# Patient Record
Sex: Female | Born: 1990 | Race: White | Hispanic: No | Marital: Single | Smoking: Current some day smoker
Health system: Southern US, Community
[De-identification: ages and names within clinical notes are randomized; demographics above are authoritative.]

## PROBLEM LIST (undated history)

## (undated) ENCOUNTER — Inpatient Hospital Stay (HOSPITAL_COMMUNITY): Payer: BLUE CROSS/BLUE SHIELD

## (undated) DIAGNOSIS — R87620 Atypical squamous cells of undetermined significance on cytologic smear of vagina (ASC-US): Secondary | ICD-10-CM

## (undated) DIAGNOSIS — J45909 Unspecified asthma, uncomplicated: Secondary | ICD-10-CM

## (undated) HISTORY — PX: NO PAST SURGERIES: SHX2092

---

## 2013-12-23 ENCOUNTER — Emergency Department (HOSPITAL_BASED_OUTPATIENT_CLINIC_OR_DEPARTMENT_OTHER)
Admission: EM | Admit: 2013-12-23 | Discharge: 2013-12-23 | Disposition: A | Discharge status: Home / Self Care | Payer: Self-pay | Attending: Emergency Medicine | Admitting: Emergency Medicine

## 2013-12-23 ENCOUNTER — Encounter (HOSPITAL_BASED_OUTPATIENT_CLINIC_OR_DEPARTMENT_OTHER): Payer: Self-pay | Admitting: Emergency Medicine

## 2013-12-23 DIAGNOSIS — R220 Localized swelling, mass and lump, head: Secondary | ICD-10-CM | POA: Insufficient documentation

## 2013-12-23 DIAGNOSIS — R221 Localized swelling, mass and lump, neck: Secondary | ICD-10-CM

## 2013-12-23 DIAGNOSIS — F17200 Nicotine dependence, unspecified, uncomplicated: Secondary | ICD-10-CM | POA: Insufficient documentation

## 2013-12-23 DIAGNOSIS — K029 Dental caries, unspecified: Secondary | ICD-10-CM | POA: Insufficient documentation

## 2013-12-23 MED ORDER — AMOXICILLIN 500 MG PO CAPS: 500.0000 mg | ORAL_CAPSULE | Freq: Three times a day (TID) | ORAL | Status: DC

## 2013-12-23 MED ORDER — IBUPROFEN 800 MG PO TABS: 800.0000 mg | ORAL_TABLET | Freq: Three times a day (TID) | ORAL | Status: DC

## 2013-12-23 NOTE — Discharge Instructions (Signed)
Dental Caries °Dental caries is tooth decay. This decay can cause a hole in teeth (cavity) that can get bigger and deeper over time. °HOME CARE °· Brush and floss your teeth. Do this at least two times a day. °· Use a fluoride toothpaste. °· Use a mouth rinse if told by your dentist or doctor. °· Eat less sugary and starchy foods. Drink less sugary drinks. °· Avoid snacking often on sugary and starchy foods. Avoid sipping often on sugary drinks. °· Keep regular checkups and cleanings with your dentist. °· Use fluoride supplements if told by your dentist or doctor. °· Allow fluoride to be applied to teeth if told by your dentist or doctor. °MAKE SURE YOU: °· Understand these instructions. °· Will watch your condition. °· Will get help right away if you are not doing well or get worse. °Document Released: 07/07/2008 Document Revised: 05/31/2013 Document Reviewed: 09/30/2012 °ExitCare® Patient Information ©2014 [employer]. ° °Dental Pain °A tooth ache may be caused by cavities (tooth decay). Cavities expose the nerve of the tooth to air and hot or cold temperatures. It may come from an infection or abscess (also called a boil or furuncle) around your tooth. It is also often caused by dental caries (tooth decay). This causes the pain you are having. °DIAGNOSIS  °Your caregiver can diagnose this problem by exam. °TREATMENT  °· If caused by an infection, it may be treated with medications which kill germs (antibiotics) and pain medications as prescribed by your caregiver. Take medications as directed. °· Only take over-the-counter or prescription medicines for pain, discomfort, or fever as directed by your caregiver. °· Whether the tooth ache today is caused by infection or dental disease, you should see your dentist as soon as possible for further care. °SEEK MEDICAL CARE IF: °The exam and treatment you received today has been provided on an emergency basis only. This is not a substitute for complete medical or dental  care. If your problem worsens or new problems (symptoms) appear, and you are unable to meet with your dentist, call or return to this location. °SEEK IMMEDIATE MEDICAL CARE IF:  °· You have a fever. °· You develop redness and swelling of your face, jaw, or neck. °· You are unable to open your mouth. °· You have severe pain uncontrolled by pain medicine. °MAKE SURE YOU:  °· Understand these instructions. °· Will watch your condition. °· Will get help right away if you are not doing well or get worse. °Document Released: 09/28/2005 Document Revised: 12/21/2011 Document Reviewed: 05/16/2008 °ExitCare® Patient Information ©2014 [employer]. ° °

## 2013-12-23 NOTE — ED Notes (Signed)
Pt at desk upset over wait time for provider eval and main concern is that her child cannot sit still with her to wait, informed per staff that provider has signed up for her case and will be in to see her shortly.  Given coloring material and stuffed animal per staff and pt now waiting in room for provider eval.

## 2013-12-23 NOTE — ED Provider Notes (Signed)
CSN: [id number]     Arrival date & time 12/23/13  1722 History   First MD Initiated Contact with Patient 12/23/13 1819     Chief Complaint  Patient presents with  . Dental Pain     (Consider location/radiation/quality/duration/timing/severity/associated sxs/prior Treatment) HPI Comments: Patient is 23 year old female who presents with right lower 2nd molar pain for 1 day - states no fever, chills, inability to open mouth.  Patient is a 23 y.o. female presenting with tooth pain. The history is provided by the patient. No language interpreter was used.  Dental Pain Location:  Lower Lower teeth location:  31/RL 2nd molar Quality:  Aching Onset quality:  Sudden Duration:  2 days Timing:  Constant Progression:  Worsening Chronicity:  New Context: dental caries   Context: not abscess, not dental fracture, filling still in place, not recent dental surgery and not trauma   Relieved by:  Nothing Worsened by:  Nothing tried Ineffective treatments:  None tried Associated symptoms: gum swelling   Associated symptoms: no congestion, no drooling, no facial pain, no facial swelling, no fever, no neck pain, no neck swelling, no oral bleeding and no trismus     History reviewed. No pertinent past medical history. History reviewed. No pertinent past surgical history. No family history on file. History  Substance Use Topics  . Smoking status: Current Every Day Smoker  . Smokeless tobacco: Not on file  . Alcohol Use: Yes   OB History   Grav Para Term Preterm Abortions TAB SAB Ect Mult Living                 Review of Systems  Constitutional: Negative for fever.  HENT: Negative for congestion, drooling and facial swelling.   Musculoskeletal: Negative for neck pain.  All other systems reviewed and are negative.      Allergies  Aspirin  Home Medications   Current Outpatient Rx  Name  Route  Sig  Dispense  Refill  . Multiple Vitamin (MULTIVITAMIN) tablet   Oral   Take 1 tablet  by mouth daily.          BP 123/65  Pulse 80  Temp(Src) 98.9 F (37.2 C) (Oral)  Resp 18  Ht 5\' 3"  (1.6 m)  Wt 165 lb (74.844 kg)  BMI 29.24 kg/m2  SpO2 100%  LMP 12/18/2013 Physical Exam  Nursing note and vitals reviewed. Constitutional: She is oriented to person, place, and time. She appears well-developed and well-nourished. No distress.  HENT:  Head: Normocephalic and atraumatic.  Right Ear: External ear normal.  Left Ear: External ear normal.  Nose: Nose normal.  Mouth/Throat: Oropharynx is clear and moist. No oropharyngeal exudate.    Eyes: Conjunctivae are normal. Pupils are equal, round, and reactive to light. No scleral icterus.  Neck: Normal range of motion.  Pulmonary/Chest: Effort normal.  Musculoskeletal: Normal range of motion. She exhibits no edema and no tenderness.  Neurological: She is alert and oriented to person, place, and time. She exhibits normal muscle tone. Coordination normal.  Skin: Skin is warm and dry. No rash noted. No erythema. No pallor.  Psychiatric: She has a normal mood and affect. Her behavior is normal. Judgment and thought content normal.    ED Course  Dental Date/Time: 12/23/2013 6:45 PM Performed by: Marisue HumbleSANFORD, Kealohilani Maiorino C. Authorized by: Patrecia PourSANFORD, Kristalynn Coddington C. Consent: Verbal consent obtained. written consent not obtained. Risks and benefits: risks, benefits and alternatives were discussed Consent given by: patient Patient understanding: patient states understanding of the  procedure being performed Patient consent: the patient's understanding of the procedure does not match consent given Procedure consent: procedure consent does not match procedure scheduled Relevant documents: relevant documents not present or verified Test results: test results not available Site marked: the operative site was not marked Imaging studies: imaging studies not available Patient identity confirmed: verbally with patient and arm band Time out:  Immediately prior to procedure a "time out" was called to verify the correct patient, procedure, equipment, support staff and site/side marked as required. Local anesthesia used: no Patient sedated: no Patient tolerance: Patient tolerated the procedure well with no immediate complications. Comments: Performed right lower inferior aveolar dental block without complications.   (including critical care time) Labs Review Labs Reviewed - No data to display Imaging Review No results found.   EKG Interpretation None      MDM   Dental pain  Patient here with pain from dental caries - offered dental block for pain control which patient accepted.  Will start on amoxicillin and ibuprofen for pain.    Izola Price Marisue Humble, [place] 12/23/13 [phone]

## 2013-12-23 NOTE — ED Notes (Signed)
Onset of right lower dental pain this morning.  No known injury.  Thinks she has a cavity or infection.

## 2013-12-24 NOTE — ED Provider Notes (Signed)
Medical screening examination/treatment/procedure(s) were performed by non-physician practitioner and as supervising physician I was immediately available for consultation/collaboration.   EKG Interpretation None        Audree CamelScott T Chaniyah Jahr, MD 12/24/13 1144

## 2014-03-24 ENCOUNTER — Encounter (HOSPITAL_BASED_OUTPATIENT_CLINIC_OR_DEPARTMENT_OTHER): Payer: Self-pay | Admitting: Emergency Medicine

## 2014-03-24 ENCOUNTER — Emergency Department (HOSPITAL_BASED_OUTPATIENT_CLINIC_OR_DEPARTMENT_OTHER)
Admission: EM | Admit: 2014-03-24 | Discharge: 2014-03-24 | Disposition: A | Discharge status: Home / Self Care | Payer: 59 | Attending: Emergency Medicine | Admitting: Emergency Medicine

## 2014-03-24 DIAGNOSIS — Z3202 Encounter for pregnancy test, result negative: Secondary | ICD-10-CM | POA: Insufficient documentation

## 2014-03-24 DIAGNOSIS — F17200 Nicotine dependence, unspecified, uncomplicated: Secondary | ICD-10-CM | POA: Insufficient documentation

## 2014-03-24 DIAGNOSIS — Z792 Long term (current) use of antibiotics: Secondary | ICD-10-CM | POA: Insufficient documentation

## 2014-03-24 DIAGNOSIS — Z79899 Other long term (current) drug therapy: Secondary | ICD-10-CM | POA: Insufficient documentation

## 2014-03-24 DIAGNOSIS — Z791 Long term (current) use of non-steroidal anti-inflammatories (NSAID): Secondary | ICD-10-CM | POA: Insufficient documentation

## 2014-03-24 DIAGNOSIS — A5901 Trichomonal vulvovaginitis: Secondary | ICD-10-CM | POA: Insufficient documentation

## 2014-03-24 DIAGNOSIS — A599 Trichomoniasis, unspecified: Secondary | ICD-10-CM

## 2014-03-24 DIAGNOSIS — N939 Abnormal uterine and vaginal bleeding, unspecified: Secondary | ICD-10-CM

## 2014-03-24 LAB — PREGNANCY, URINE: Preg Test, Ur: NEGATIVE

## 2014-03-24 LAB — CBC
HCT: 39.8 % (ref 36.0–46.0)
HEMOGLOBIN: 13.7 g/dL (ref 12.0–15.0)
MCH: 31.1 pg (ref 26.0–34.0)
MCHC: 34.4 g/dL (ref 30.0–36.0)
MCV: 90.2 fL (ref 78.0–100.0)
PLATELETS: 161 10*3/uL (ref 150–400)
RBC: 4.41 MIL/uL (ref 3.87–5.11)
RDW: 12.6 % (ref 11.5–15.5)
WBC: 9.1 10*3/uL (ref 4.0–10.5)

## 2014-03-24 LAB — URINALYSIS, ROUTINE W REFLEX MICROSCOPIC
Bilirubin Urine: NEGATIVE
Glucose, UA: NEGATIVE mg/dL
Ketones, ur: NEGATIVE mg/dL
LEUKOCYTES UA: NEGATIVE
Nitrite: NEGATIVE
PROTEIN: NEGATIVE mg/dL
Specific Gravity, Urine: 1.005 (ref 1.005–1.030)
UROBILINOGEN UA: 0.2 mg/dL (ref 0.0–1.0)
pH: 7.5 (ref 5.0–8.0)

## 2014-03-24 LAB — URINE MICROSCOPIC-ADD ON

## 2014-03-24 LAB — WET PREP, GENITAL
Trich, Wet Prep: NONE SEEN
Yeast Wet Prep HPF POC: NONE SEEN

## 2014-03-24 MED ORDER — METRONIDAZOLE 500 MG PO TABS
2000.0000 mg | ORAL_TABLET | Freq: Once | ORAL | Status: AC
  Administered 2014-03-24: 2000 mg via ORAL
  Filled 2014-03-24: qty 4

## 2014-03-24 MED ORDER — FLUCONAZOLE 150 MG PO TABS: 150.0000 mg | ORAL_TABLET | Freq: Every day | ORAL | Status: AC

## 2014-03-24 NOTE — ED Provider Notes (Signed)
CSN: [id number]     Arrival date & time 03/24/14  1148 History   First MD Initiated Contact with Patient 03/24/14 1159     Chief Complaint  Patient presents with  . Vaginal Bleeding     (Consider location/radiation/quality/duration/timing/severity/associated sxs/prior Treatment) Patient is a 23 y.o. female presenting with vaginal bleeding. The history is provided by the patient. No language interpreter was used.  Vaginal Bleeding Quality:  Clots Severity:  Moderate Possible pregnancy: no   Associated symptoms: abdominal pain and back pain   Associated symptoms: no dysuria, no fever and no nausea   Associated symptoms comment:  She had a normal menses at the end of last month (2 weeks ago) and woke up during the night with vaginal bleeding and severe lower abdominal cramping. She continues with a heavy flow, including clots, and pain this morning. No fever. She denies dysuria or history of irregular menses.    History reviewed. No pertinent past medical history. History reviewed. No pertinent past surgical history. No family history on file. History  Substance Use Topics  . Smoking status: Current Every Day Smoker  . Smokeless tobacco: Not on file  . Alcohol Use: Yes   OB History   Grav Para Term Preterm Abortions TAB SAB Ect Mult Living                 Review of Systems  Constitutional: Negative for fever.  Gastrointestinal: Positive for abdominal pain. Negative for nausea and vomiting.  Genitourinary: Positive for vaginal bleeding and pelvic pain. Negative for dysuria.  Musculoskeletal: Positive for back pain.  Neurological: Negative for light-headedness.      Allergies  Aspirin  Home Medications   Prior to Admission medications   Medication Sig Start Date End Date Taking? Authorizing Provider  amoxicillin (AMOXIL) 500 MG capsule Take 1 capsule (500 mg total) by mouth 3 (three) times daily. 12/23/13   Izola PriceFrances C. Sanford, PA-C  ibuprofen (ADVIL,MOTRIN) 800 MG tablet  Take 1 tablet (800 mg total) by mouth 3 (three) times daily. 12/23/13   Izola PriceFrances C. Sanford, PA-C  Multiple Vitamin (MULTIVITAMIN) tablet Take 1 tablet by mouth daily.    Historical Provider, MD   BP 117/76  Pulse 77  Temp(Src) 98.7 F (37.1 C) (Oral)  Resp 18  Ht 5\' 3"  (1.6 m)  Wt 165 lb (74.844 kg)  BMI 29.24 kg/m2  SpO2 100%  LMP 03/07/2014 Physical Exam  Constitutional: She appears well-developed and well-nourished.  HENT:  Head: Normocephalic.  Neck: Normal range of motion. Neck supple.  Cardiovascular: Normal rate and regular rhythm.   Pulmonary/Chest: Effort normal and breath sounds normal.  Abdominal: Soft. Bowel sounds are normal. There is no tenderness. There is no rebound and no guarding.  Genitourinary: Vagina normal and uterus normal.  Cervical bleeding with clots present. Diffusely tender throughout pelvis without mass.    Musculoskeletal: Normal range of motion.  Neurological: She is alert. No cranial nerve deficit.  Skin: Skin is warm and dry. No rash noted.  Psychiatric: She has a normal mood and affect.    ED Course  Procedures (including critical care time) Labs Review Labs Reviewed  PREGNANCY, URINE  URINALYSIS, ROUTINE W REFLEX MICROSCOPIC   Results for orders placed during the hospital encounter of 03/24/14  WET PREP, GENITAL      Result Value Ref Range   Yeast Wet Prep HPF POC NONE SEEN  NONE SEEN   Trich, Wet Prep NONE SEEN  NONE SEEN   Clue Cells Wet Prep  HPF POC FEW (*) NONE SEEN   WBC, Wet Prep HPF POC FEW (*) NONE SEEN  GC/CHLAMYDIA PROBE AMP      Result Value Ref Range   CT Probe RNA NEGATIVE  NEGATIVE   GC Probe RNA NEGATIVE  NEGATIVE  PREGNANCY, URINE      Result Value Ref Range   Preg Test, Ur NEGATIVE  NEGATIVE  URINALYSIS, ROUTINE W REFLEX MICROSCOPIC      Result Value Ref Range   Color, Urine YELLOW  YELLOW   APPearance CLEAR  CLEAR   Specific Gravity, Urine 1.005  1.005 - 1.030   pH 7.5  5.0 - 8.0   Glucose, UA NEGATIVE   NEGATIVE mg/dL   Hgb urine dipstick SMALL (*) NEGATIVE   Bilirubin Urine NEGATIVE  NEGATIVE   Ketones, ur NEGATIVE  NEGATIVE mg/dL   Protein, ur NEGATIVE  NEGATIVE mg/dL   Urobilinogen, UA 0.2  0.0 - 1.0 mg/dL   Nitrite NEGATIVE  NEGATIVE   Leukocytes, UA NEGATIVE  NEGATIVE  CBC      Result Value Ref Range   WBC 9.1  4.0 - 10.5 K/uL   RBC 4.41  3.87 - 5.11 MIL/uL   Hemoglobin 13.7  12.0 - 15.0 g/dL   HCT 16.139.8  09.636.0 - 04.546.0 %   MCV 90.2  78.0 - 100.0 fL   MCH 31.1  26.0 - 34.0 pg   MCHC 34.4  30.0 - 36.0 g/dL   RDW 40.912.6  81.111.5 - 91.415.5 %   Platelets 161  150 - 400 K/uL  URINE MICROSCOPIC-ADD ON      Result Value Ref Range   Squamous Epithelial / LPF RARE  RARE   WBC, UA 0-2  <3 WBC/hpf   Urine-Other TRICHOMONAS PRESENT     No results found.  Imaging Review No results found.   EKG Interpretation None      MDM   Final diagnoses:  None    1. Vaginal bleeding 2. Trichomonas infection  HGB stable, negative pregnancy, essentially benign exam. Patient stable for discharge home and outpatient GYN follow up for irregular bleeding.    Arnoldo HookerShari A Annessa Satre, PA-C 04/02/14 1615

## 2014-03-24 NOTE — Discharge Instructions (Signed)
Trichomoniasis Trichomoniasis is an infection, caused by the Trichomonas organism, that affects both women and men. In women, the outer female genitalia and the vagina are affected. In men, the penis is mainly affected, but the prostate and other reproductive organs can also be involved. Trichomoniasis is a sexually transmitted disease (STD) and is most often passed to another person through sexual contact. The majority of people who get trichomoniasis do so from a sexual encounter and are also at risk for other STDs. CAUSES   Sexual intercourse with an infected partner.  It can be present in swimming pools or hot tubs. SYMPTOMS   Abnormal gray-green frothy vaginal discharge in women.  Vaginal itching and irritation in women.  Itching and irritation of the area outside the vagina in women.  Penile discharge with or without pain in males.  Inflammation of the urethra (urethritis), causing painful urination.  Bleeding after sexual intercourse. RELATED COMPLICATIONS  Pelvic inflammatory disease.  Infection of the uterus (endometritis).  Infertility.  Tubal (ectopic) pregnancy.  It can be associated with other STDs, including gonorrhea and chlamydia, hepatitis B, and HIV. COMPLICATIONS DURING PREGNANCY  Early (premature) delivery.  Premature rupture of the membranes (PROM).  Low birth weight. DIAGNOSIS   Visualization of Trichomonas under the microscope from the vagina discharge.  Ph of the vagina greater than 4.5, tested with a test tape.  Trich Rapid Test.  Culture of the organism, but this is not usually needed.  It may be found on a Pap test.  Having a "strawberry cervix,"which means the cervix looks very red like a strawberry. TREATMENT   You may be given medication to fight the infection. Inform your caregiver if you could be or are pregnant. Some medications used to treat the infection should not be taken during pregnancy.  Over-the-counter medications or  creams to decrease itching or irritation may be recommended.  Your sexual partner will need to be treated if infected. HOME CARE INSTRUCTIONS   Take all medication prescribed by your caregiver.  Take over-the-counter medication for itching or irritation as directed by your caregiver.  Do not have sexual intercourse while you have the infection.  Do not douche or wear tampons.  Discuss your infection with your partner, as your partner may have acquired the infection from you. Or, your partner may have been the person who transmitted the infection to you.  Have your sex partner examined and treated if necessary.  Practice safe, informed, and protected sex.  See your caregiver for other STD testing. SEEK MEDICAL CARE IF:   You still have symptoms after you finish the medication.  You have an oral temperature above 102 F (38.9 C).  You develop belly (abdominal) pain.  You have pain when you urinate.  You have bleeding after sexual intercourse.  You develop a rash.  The medication makes you sick or makes you throw up (vomit). Document Released: 03/24/2001 Document Revised: 12/21/2011 Document Reviewed: 04/19/2009 [facility] Patient Information 2014 [place], [place].  Abnormal Uterine Bleeding Abnormal uterine bleeding means bleeding from the vagina that is not your normal menstrual period. This can be:  Bleeding or spotting between periods.  Bleeding after sex (sexual intercourse).  Bleeding that is heavier or more than normal.  Periods that last longer than usual.  Bleeding after menopause. There are many problems that may cause this. Treatment will depend on the cause of the bleeding. Any kind of bleeding that is not normal should be reviewed by your doctor.  HOME CARE Watch your condition  for any changes. These actions may lessen any discomfort you are having:  Do not use tampons or douches as told by your doctor.  Change your pads often. You should get regular  pelvic exams and Pap tests. Keep all appointments for tests as told by your doctor. GET HELP IF:  You are bleeding for more than 1 week.  You feel dizzy at times. GET HELP RIGHT AWAY IF:   You pass out.  You have to change pads every 15 to 30 minutes.  You have belly pain.  You have a fever.  You become sweaty or weak.  You are passing large blood clots from the vagina.  You feel sick to your stomach (nauseous) and throw up (vomit). MAKE SURE YOU:  Understand these instructions.  Will watch your condition.  Will get help right away if you are not doing well or get worse. Document Released: 07/26/2009 Document Revised: 07/19/2013 Document Reviewed: 04/27/2013 [facility] Center[facility] Patient Information 2014 [place]Care, [place].

## 2014-03-24 NOTE — ED Notes (Signed)
States she is passing large vaginal clots since this morning. Also states that she is having severe cramping.

## 2014-03-26 LAB — GC/CHLAMYDIA PROBE AMP
CT Probe RNA: NEGATIVE
GC Probe RNA: NEGATIVE

## 2014-04-02 NOTE — ED Provider Notes (Signed)
Medical screening examination/treatment/procedure(s) were performed by non-physician practitioner and as supervising physician I was immediately available for consultation/collaboration.   EKG Interpretation None        Lyanne CoKevin M Shanterica Biehler, MD 04/02/14 1821

## 2014-05-15 ENCOUNTER — Encounter (HOSPITAL_BASED_OUTPATIENT_CLINIC_OR_DEPARTMENT_OTHER): Payer: Self-pay | Admitting: Emergency Medicine

## 2014-05-15 ENCOUNTER — Emergency Department (HOSPITAL_BASED_OUTPATIENT_CLINIC_OR_DEPARTMENT_OTHER)
Admission: EM | Admit: 2014-05-15 | Discharge: 2014-05-15 | Disposition: A | Discharge status: Home / Self Care | Payer: 59 | Attending: Emergency Medicine | Admitting: Emergency Medicine

## 2014-05-15 DIAGNOSIS — Z791 Long term (current) use of non-steroidal anti-inflammatories (NSAID): Secondary | ICD-10-CM | POA: Diagnosis not present

## 2014-05-15 DIAGNOSIS — Z3202 Encounter for pregnancy test, result negative: Secondary | ICD-10-CM | POA: Insufficient documentation

## 2014-05-15 DIAGNOSIS — F17200 Nicotine dependence, unspecified, uncomplicated: Secondary | ICD-10-CM | POA: Insufficient documentation

## 2014-05-15 DIAGNOSIS — Z792 Long term (current) use of antibiotics: Secondary | ICD-10-CM | POA: Diagnosis not present

## 2014-05-15 DIAGNOSIS — J45909 Unspecified asthma, uncomplicated: Secondary | ICD-10-CM | POA: Diagnosis not present

## 2014-05-15 DIAGNOSIS — G43709 Chronic migraine without aura, not intractable, without status migrainosus: Secondary | ICD-10-CM | POA: Diagnosis not present

## 2014-05-15 DIAGNOSIS — R42 Dizziness and giddiness: Secondary | ICD-10-CM | POA: Diagnosis present

## 2014-05-15 HISTORY — DX: Unspecified asthma, uncomplicated: J45.909

## 2014-05-15 LAB — URINALYSIS, ROUTINE W REFLEX MICROSCOPIC
BILIRUBIN URINE: NEGATIVE
Glucose, UA: NEGATIVE mg/dL
Hgb urine dipstick: NEGATIVE
KETONES UR: NEGATIVE mg/dL
Leukocytes, UA: NEGATIVE
NITRITE: NEGATIVE
PROTEIN: NEGATIVE mg/dL
Specific Gravity, Urine: 1.002 — ABNORMAL LOW (ref 1.005–1.030)
Urobilinogen, UA: 0.2 mg/dL (ref 0.0–1.0)
pH: 7 (ref 5.0–8.0)

## 2014-05-15 LAB — BASIC METABOLIC PANEL
Anion gap: 14 (ref 5–15)
BUN: 5 mg/dL — AB (ref 6–23)
CO2: 24 mEq/L (ref 19–32)
CREATININE: 0.5 mg/dL (ref 0.50–1.10)
Calcium: 9.4 mg/dL (ref 8.4–10.5)
Chloride: 102 mEq/L (ref 96–112)
GFR calc Af Amer: >90 mL/min (ref >90)
GFR calc non Af Amer: >90 mL/min (ref >90)
GLUCOSE: 92 mg/dL (ref 70–99)
Potassium: 3.7 mEq/L (ref 3.7–5.3)
Sodium: 140 mEq/L (ref 137–147)

## 2014-05-15 LAB — CBG MONITORING, ED: Glucose-Capillary: 99 mg/dL (ref 70–99)

## 2014-05-15 LAB — PREGNANCY, URINE: Preg Test, Ur: NEGATIVE

## 2014-05-15 MED ORDER — SODIUM CHLORIDE 0.9 % IV BOLUS (SEPSIS)
1000.0000 mL | Freq: Once | INTRAVENOUS | Status: AC
Start: 2014-05-15 — End: 2014-05-15
  Administered 2014-05-15: 1000 mL via INTRAVENOUS

## 2014-05-15 NOTE — ED Notes (Signed)
Pt reports dizziness x 2 weeks with onset of "on and off" blurry vision this morning.

## 2014-05-15 NOTE — ED Provider Notes (Signed)
Medical screening examination/treatment/procedure(s) were performed by non-physician practitioner and as supervising physician I was immediately available for consultation/collaboration.   EKG Interpretation None       Martha K Linker, MD 05/15/14 1915 

## 2014-05-15 NOTE — Discharge Instructions (Signed)
Recurrent Migraine Headache A migraine headache is an intense, throbbing pain on one or both sides of your head. Recurrent migraines keep coming back. A migraine can last for 30 minutes to several hours. CAUSES  The exact cause of a migraine headache is not always known. However, a migraine may be caused when nerves in the brain become irritated and release chemicals that cause inflammation. This causes pain. Certain things may also trigger migraines, such as:   Alcohol.  Smoking.  Stress.  Menstruation.  Aged cheeses.  Foods or drinks that contain nitrates, glutamate, aspartame, or tyramine.  Lack of sleep.  Chocolate.  Caffeine.  Hunger.  Physical exertion.  Fatigue.  Medicines used to treat chest pain (nitroglycerine), birth control pills, estrogen, and some blood pressure medicines. SYMPTOMS   Pain on one or both sides of your head.  Pulsating or throbbing pain.  Severe pain that prevents daily activities.  Pain that is aggravated by any physical activity.  Nausea, vomiting, or both.  Dizziness.  Pain with exposure to bright lights, loud noises, or activity.  General sensitivity to bright lights, loud noises, or smells. Before you get a migraine, you may get warning signs that a migraine is coming (aura). An aura may include:  Seeing flashing lights.  Seeing bright spots, halos, or zigzag lines.  Having tunnel vision or blurred vision.  Having feelings of numbness or tingling.  Having trouble talking.  Having muscle weakness. DIAGNOSIS  A recurrent migraine headache is often diagnosed based on:  Symptoms.  Physical examination.  A CT scan or MRI of your head. These imaging tests cannot diagnose migraines but can help rule out other causes of headaches.  TREATMENT  Medicines may be given for pain and nausea. Medicines can also be given to help prevent recurrent migraines. HOME CARE INSTRUCTIONS  Only take over-the-counter or prescription  medicines for pain or discomfort as directed by your health care provider. The use of long-term narcotics is not recommended.  Lie down in a dark, quiet room when you have a migraine.  Keep a journal to find out what may trigger your migraine headaches. For example, write down:  What you eat and drink.  How much sleep you get.  Any change to your diet or medicines.  Limit alcohol consumption.  Quit smoking if you smoke.  Get 7-9 hours of sleep, or as recommended by your health care provider.  Limit stress.  Keep lights dim if bright lights bother you and make your migraines worse. SEEK MEDICAL CARE IF:   You do not get relief from the medicines given to you.  You have a recurrence of pain.  You have a fever. SEEK IMMEDIATE MEDICAL CARE IF:  Your migraine becomes severe.  You have a stiff neck.  You have loss of vision.  You have muscular weakness or loss of muscle control.  You start losing your balance or have trouble walking.  You feel faint or pass out.  You have severe symptoms that are different from your first symptoms. MAKE SURE YOU:   Understand these instructions.  Will watch your condition.  Will get help right away if you are not doing well or get worse. Document Released: 06/23/2001 Document Revised: 02/12/2014 Document Reviewed: 06/05/2013 Exit[employer] Patient Information 2015 [employer]. This information is not intended to replace advice given to you by your health care provider. Make sure you discuss any questions you have with your health care provider.  

## 2014-05-15 NOTE — ED Provider Notes (Signed)
CSN: [id number]     Arrival date & time 05/15/14  1642 History   First MD Initiated Contact with Patient 05/15/14 1653     Chief Complaint  Patient presents with  . Dizziness     (Consider location/radiation/quality/duration/timing/severity/associated sxs/prior Treatment) Patient is a 23 y.o. female presenting with dizziness. The history is provided by the patient. No language interpreter was used.  Dizziness Associated symptoms: headaches and vision changes   Associated symptoms: no chest pain, no diarrhea, no nausea, no shortness of breath and no vomiting   Associated symptoms comment:  Ashley Tate is a 23 year old female with a hx of asthma who presents to the ED with blurry vision and headache since this morning. Patient states she was at work on the computer when she began to notice her vision getting progressively more blurry. Her headache pain is located in the right parietal region and is described as throbbing locally with "a lot of pressure throughout." Took tylenol with mild symptom relief. States she has had 2 weeks of lightheadedness with intermittent headaches. She has used her Albuterol inhaler twice in the past week thinking her symptoms could be due to her asthma. Denies SOB, nausea, vomiting, appetite changes, fever or chills.     Past Medical History  Diagnosis Date  . Asthma    History reviewed. No pertinent past surgical history. No family history on file. History  Substance Use Topics  . Smoking status: Current Every Day Smoker  . Smokeless tobacco: Not on file  . Alcohol Use: Yes   OB History   Grav Para Term Preterm Abortions TAB SAB Ect Mult Living                 Review of Systems  Constitutional: Negative for fever.  Eyes: Positive for photophobia and visual disturbance.  Respiratory: Negative for shortness of breath.   Cardiovascular: Negative for chest pain.  Gastrointestinal: Negative for nausea, vomiting and diarrhea.  Musculoskeletal: Negative  for gait problem and neck stiffness.  Neurological: Positive for dizziness, light-headedness and headaches.  All other systems reviewed and are negative.     Allergies  Aspirin  Home Medications   Prior to Admission medications   Medication Sig Start Date End Date Taking? Authorizing Provider  amoxicillin (AMOXIL) 500 MG capsule Take 1 capsule (500 mg total) by mouth 3 (three) times daily. 12/23/13   Izola Price. Sanford, PA-C  ibuprofen (ADVIL,MOTRIN) 800 MG tablet Take 1 tablet (800 mg total) by mouth 3 (three) times daily. 12/23/13   Izola Price. Sanford, PA-C  Multiple Vitamin (MULTIVITAMIN) tablet Take 1 tablet by mouth daily.    Historical Provider, MD   BP 130/59  Pulse 85  Temp(Src) 99.5 F (37.5 C) (Oral)  Resp 16  Ht 5\' 3"  (1.6 m)  Wt 160 lb (72.576 kg)  BMI 28.35 kg/m2  SpO2 98%  LMP 04/22/2014 Physical Exam  Constitutional: She appears well-developed and well-nourished.  HENT:  Head: Normocephalic.  Eyes: EOM are normal. Pupils are equal, round, and reactive to light.  Neck: Normal range of motion. Neck supple.  Cardiovascular: Normal rate and regular rhythm.   Pulmonary/Chest: Effort normal and breath sounds normal.  Abdominal: Soft. Bowel sounds are normal. There is no tenderness. There is no rebound and no guarding.  Musculoskeletal: Normal range of motion.  Neurological: She is alert. No cranial nerve deficit. Gait normal.  Skin: Skin is warm and dry. No rash noted.  Psychiatric: She has a normal mood and affect.  ED Course  Procedures (including critical care time) Labs Review Labs Reviewed  URINALYSIS, ROUTINE W REFLEX MICROSCOPIC  PREGNANCY, URINE  BASIC METABOLIC PANEL  CBG MONITORING, ED   Results for orders placed during the hospital encounter of 05/15/14  URINALYSIS, ROUTINE W REFLEX MICROSCOPIC      Result Value Ref Range   Color, Urine YELLOW  YELLOW   APPearance CLEAR  CLEAR   Specific Gravity, Urine 1.002 (*) 1.005 - 1.030   pH 7.0  5.0  - 8.0   Glucose, UA NEGATIVE  NEGATIVE mg/dL   Hgb urine dipstick NEGATIVE  NEGATIVE   Bilirubin Urine NEGATIVE  NEGATIVE   Ketones, ur NEGATIVE  NEGATIVE mg/dL   Protein, ur NEGATIVE  NEGATIVE mg/dL   Urobilinogen, UA 0.2  0.0 - 1.0 mg/dL   Nitrite NEGATIVE  NEGATIVE   Leukocytes, UA NEGATIVE  NEGATIVE  PREGNANCY, URINE      Result Value Ref Range   Preg Test, Ur NEGATIVE  NEGATIVE  CBG MONITORING, ED      Result Value Ref Range   Glucose-Capillary 99  70 - 99 mg/dL    Imaging Review No results found.   EKG Interpretation None      MDM  Anemia- H&H Pregnancy- UPT Migraine w/ blurred vision Asthma- lungs sounded clear; no wheezing noted  Final diagnoses:  None    1835- headache and blurry vision decreasing in severity with IVF. Feels less fatigued. UPT negative, UA normal, CBG WNL  She is ambulatory in the department, using her phone freely and without obvious visual problems. VSS. Headache is resolved without further intervention beyond IV fluids. She is well appearing and stable for discharge.   1. Migraine     Arnoldo HookerShari A Akash Winski, PA-C 05/15/14 [phone]

## 2015-09-23 ENCOUNTER — Encounter (HOSPITAL_BASED_OUTPATIENT_CLINIC_OR_DEPARTMENT_OTHER): Payer: Self-pay | Admitting: Emergency Medicine

## 2015-09-23 ENCOUNTER — Emergency Department (HOSPITAL_BASED_OUTPATIENT_CLINIC_OR_DEPARTMENT_OTHER): Payer: BLUE CROSS/BLUE SHIELD

## 2015-09-23 ENCOUNTER — Emergency Department (HOSPITAL_BASED_OUTPATIENT_CLINIC_OR_DEPARTMENT_OTHER)
Admission: EM | Admit: 2015-09-23 | Discharge: 2015-09-23 | Disposition: A | Discharge status: Home / Self Care | Payer: BLUE CROSS/BLUE SHIELD | Attending: Emergency Medicine | Admitting: Emergency Medicine

## 2015-09-23 DIAGNOSIS — Z79899 Other long term (current) drug therapy: Secondary | ICD-10-CM | POA: Diagnosis not present

## 2015-09-23 DIAGNOSIS — F17200 Nicotine dependence, unspecified, uncomplicated: Secondary | ICD-10-CM | POA: Insufficient documentation

## 2015-09-23 DIAGNOSIS — Z792 Long term (current) use of antibiotics: Secondary | ICD-10-CM | POA: Insufficient documentation

## 2015-09-23 DIAGNOSIS — R05 Cough: Secondary | ICD-10-CM | POA: Insufficient documentation

## 2015-09-23 DIAGNOSIS — J45901 Unspecified asthma with (acute) exacerbation: Secondary | ICD-10-CM | POA: Insufficient documentation

## 2015-09-23 DIAGNOSIS — Z791 Long term (current) use of non-steroidal anti-inflammatories (NSAID): Secondary | ICD-10-CM | POA: Insufficient documentation

## 2015-09-23 DIAGNOSIS — R059 Cough, unspecified: Secondary | ICD-10-CM

## 2015-09-23 MED ORDER — ALBUTEROL SULFATE HFA 108 (90 BASE) MCG/ACT IN AERS: 1.0000 | INHALATION_SPRAY | Freq: Four times a day (QID) | RESPIRATORY_TRACT | Status: DC | PRN

## 2015-09-23 MED ORDER — DEXAMETHASONE SODIUM PHOSPHATE 10 MG/ML IJ SOLN
10.0000 mg | Freq: Once | INTRAMUSCULAR | Status: AC
  Administered 2015-09-23: 10 mg via INTRAMUSCULAR
  Filled 2015-09-23: qty 1

## 2015-09-23 NOTE — ED Provider Notes (Signed)
CSN: [id number]     Arrival date & time 09/23/15  1549 History  By signing my name below, I, Gonzella Lex, attest that this documentation has been prepared under the direction and in the presence of Nelva Nay, MD. Electronically Signed: Gonzella Lex, Scribe. 09/23/2015. 4:32 PM.    Chief Complaint  Patient presents with  . Shortness of Breath  . Cough    Patient is a 24 y.o. female presenting with cough. The history is provided by the patient. No language interpreter was used.  Cough  HPI Comments: Ashley Tate is a 24 y.o. female with a h/o asthma who presents to the Emergency Department complaining of a productive cough and SOB for the past three days with associated difficulty sleeping secondary to cough. Pt reports that she has taken mucinex and robitussen with little to no relief. She states that the mucous has gradually begun to clear up. Pt notes that she sometimes uses an inhaler at home but states that she has run out. Pt denies wheezing and hx of DM. She is sensitive to aspirin.   Past Medical History  Diagnosis Date  . Asthma    History reviewed. No pertinent past surgical history. History reviewed. No pertinent family history. Social History  Substance Use Topics  . Smoking status: Current Every Day Smoker  . Smokeless tobacco: None  . Alcohol Use: Yes   OB History    No data available     Review of Systems  Respiratory: Positive for cough.    A complete 10 system review of systems was obtained and all systems are negative except as noted in the HPI and PMH.   Allergies  Aspirin  Home Medications   Prior to Admission medications   Medication Sig Start Date End Date Taking? Authorizing Provider  amoxicillin (AMOXIL) 500 MG capsule Take 1 capsule (500 mg total) by mouth 3 (three) times daily. 12/23/13   Cherrie Distance, PA-C  ibuprofen (ADVIL,MOTRIN) 800 MG tablet Take 1 tablet (800 mg total) by mouth 3 (three) times daily. 12/23/13   Cherrie Distance, PA-C  Multiple Vitamin (MULTIVITAMIN) tablet Take 1 tablet by mouth daily.    Historical Provider, MD   BP 132/82 mmHg  Pulse 94  Temp(Src) 98.8 F (37.1 C) (Oral)  Resp 22  Ht  (1.6 m)  Wt 165 lb (74.844 kg)  BMI 29.24 kg/m2  SpO2 100%  LMP 09/11/2015 Physical Exam Physical Exam  Nursing note and vitals reviewed. Constitutional: She is oriented to person, place, and time. She appears well-developed and well-nourished. No distress.  HENT:  Head: Normocephalic and atraumatic.  Eyes: Pupils are equal, round, and reactive to light.  Neck: Normal range of motion.  Cardiovascular: Normal rate and intact distal pulses.   Pulmonary/Chest: No respiratory distress.  Abdominal: Normal appearance. She exhibits no distension.  Musculoskeletal: Normal range of motion.  Neurological: She is alert and oriented to person, place, and time. No cranial nerve deficit.  Skin: Skin is warm and dry. No rash noted.  Psychiatric: She has a normal mood and affect. Her behavior is normal.   ED Course  Procedures  DIAGNOSTIC STUDIES:    Oxygen Saturation is 100% on RA, normal by my interpretation.   COORDINATION OF CARE:  4:16 PM Reviewed pt's chest x-ray. Will refill pt's inhaler. Will administer 10 mg dexamethasone injection to pt in the ED. Discussed treatment plan with pt at bedside and pt agreed to plan.   Imaging Review Dg Chest 2  View  09/23/2015  CLINICAL DATA:  Cough and shortness of breath for 1 week. Asthma. Smoker. EXAM: CHEST  2 VIEW COMPARISON:  None. FINDINGS: The heart size and mediastinal contours are within normal limits. Both lungs are clear. The visualized skeletal structures are unremarkable. IMPRESSION: No active cardiopulmonary disease. Electronically Signed   By: Myles RosenthalJohn  Stahl M.D.   On: 09/23/2015 16:16   I have personally reviewed and evaluated these images as part of my medical decision-making.    MDM   Final diagnoses:  None   I personally performed  the services described in this documentation, which was scribed in my presence. The recorded information has been reviewed and considered.   Nelva Nayobert Giulliana Mcroberts, MD 09/23/15 [phone]

## 2015-09-23 NOTE — Discharge Instructions (Signed)

## 2015-09-23 NOTE — ED Notes (Signed)
Patient states that she has been sick for the last 2 -3 days. Reports that she has had a cough. PAtient reports that today she has had a hard time catching her breath.

## 2016-05-18 ENCOUNTER — Emergency Department (HOSPITAL_BASED_OUTPATIENT_CLINIC_OR_DEPARTMENT_OTHER)
Admission: EM | Admit: 2016-05-18 | Discharge: 2016-05-18 | Disposition: A | Discharge status: Home / Self Care | Payer: BLUE CROSS/BLUE SHIELD | Attending: Emergency Medicine | Admitting: Emergency Medicine

## 2016-05-18 ENCOUNTER — Emergency Department (HOSPITAL_BASED_OUTPATIENT_CLINIC_OR_DEPARTMENT_OTHER): Payer: BLUE CROSS/BLUE SHIELD

## 2016-05-18 ENCOUNTER — Encounter (HOSPITAL_BASED_OUTPATIENT_CLINIC_OR_DEPARTMENT_OTHER): Payer: Self-pay | Admitting: Emergency Medicine

## 2016-05-18 DIAGNOSIS — R0602 Shortness of breath: Secondary | ICD-10-CM | POA: Insufficient documentation

## 2016-05-18 DIAGNOSIS — R079 Chest pain, unspecified: Secondary | ICD-10-CM | POA: Insufficient documentation

## 2016-05-18 LAB — BASIC METABOLIC PANEL
Anion gap: 8 (ref 5–15)
BUN: 5 mg/dL — ABNORMAL LOW (ref 6–20)
CO2: 24 mmol/L (ref 22–32)
Calcium: 9 mg/dL (ref 8.9–10.3)
Chloride: 105 mmol/L (ref 101–111)
Creatinine, Ser: 0.58 mg/dL (ref 0.44–1.00)
GFR calc Af Amer: >60 mL/min (ref >60)
GLUCOSE: 85 mg/dL (ref 65–99)
POTASSIUM: 3.6 mmol/L (ref 3.5–5.1)
Sodium: 137 mmol/L (ref 135–145)

## 2016-05-18 LAB — CBC WITH DIFFERENTIAL/PLATELET
BASOS ABS: 0 10*3/uL (ref 0.0–0.1)
Basophils Relative: 0 %
Eosinophils Absolute: 0.2 10*3/uL (ref 0.0–0.7)
Eosinophils Relative: 2 %
HEMATOCRIT: 42.5 % (ref 36.0–46.0)
Hemoglobin: 14.5 g/dL (ref 12.0–15.0)
Lymphocytes Relative: 26 %
Lymphs Abs: 2.5 10*3/uL (ref 0.7–4.0)
MCH: 30.7 pg (ref 26.0–34.0)
MCHC: 34.1 g/dL (ref 30.0–36.0)
MCV: 90 fL (ref 78.0–100.0)
MONO ABS: 0.4 10*3/uL (ref 0.1–1.0)
Monocytes Relative: 5 %
NEUTROS ABS: 6.3 10*3/uL (ref 1.7–7.7)
Neutrophils Relative %: 67 %
Platelets: 148 10*3/uL — ABNORMAL LOW (ref 150–400)
RBC: 4.72 MIL/uL (ref 3.87–5.11)
RDW: 12.9 % (ref 11.5–15.5)
WBC: 9.5 10*3/uL (ref 4.0–10.5)

## 2016-05-18 LAB — D-DIMER, QUANTITATIVE (NOT AT ARMC): D-Dimer, Quant: <0.27 ug/mL-FEU (ref 0.00–0.50)

## 2016-05-18 NOTE — ED Notes (Signed)
MD at bedside. 

## 2016-05-18 NOTE — Discharge Instructions (Signed)
You have been seen today for shortness of breath. Your imaging and lab tests showed no abnormalities. Follow up with PCP as needed should symptoms continue. Return to ED should symptoms worsen.

## 2016-05-18 NOTE — ED Triage Notes (Signed)
Pt states right shoulder pain x 2 weeks and started having chest pain in the center of chest with asthma acting up.  EKG done, pt A/o in NAD

## 2016-05-18 NOTE — ED Provider Notes (Signed)
[facility]   CSN: [id number] Arrival date & time: 05/18/16  1525  First Provider Contact:  First MD Initiated Contact with Patient 05/18/16 1808      By signing my name below, I, Emmanuella Mensah, attest that this documentation has been prepared under the direction and in the presence of Shawn Joy, PA-C. Electronically Signed: Angelene Giovanni, ED Scribe. 05/18/16. 6:16 PM.   History   Chief Complaint Chief Complaint  Patient presents with  . Shortness of Breath    HPI Comments: Ashley Tate is a 25 y.o. female who presents to the Emergency Department complaining of ongoing constant moderate sharp right shoulder pain onset 2 weeks ago. She reports associated constant heavy substernal chest pain and SOB onset today. She adds that the chest pain began as sharp pains but now it feels as though "something is sitting on her chest". She denies that the shoulder pain is worse with movement. No alleviating factors noted. Pt has not tried any medications PTA. She reports that she took an 18 hour car trip within 3 days prior to onset of pain. She denies a hx of PE/DVT. No current hormonal therapy. She denies any fever, cough, n/v, neuro deficits, or any other complaints.    The history is provided by the patient. No language interpreter was used.    Past Medical History:  Diagnosis Date  . Asthma     There are no active problems to display for this patient.   History reviewed. No pertinent surgical history.  OB History    No data available       Home Medications    Prior to Admission medications   Medication Sig Start Date End Date Taking? Authorizing Provider  albuterol (PROVENTIL HFA;VENTOLIN HFA) 108 (90 BASE) MCG/ACT inhaler Inhale 1-2 puffs into the lungs every 6 (six) hours as needed for wheezing or shortness of breath. 09/23/15   Nelva Nay, MD  ibuprofen (ADVIL,MOTRIN) 800 MG tablet Take 1 tablet (800 mg total) by mouth 3 (three) times  daily. 12/23/13   Cherrie Distance, PA-C  Multiple Vitamin (MULTIVITAMIN) tablet Take 1 tablet by mouth daily.    Historical Provider, MD    Family History History reviewed. No pertinent family history.  Social History Social History  Substance Use Topics  . Smoking status: Current Every Day Smoker  . Smokeless tobacco: Never Used  . Alcohol use Yes     Allergies   Aspirin   Review of Systems Review of Systems  Constitutional: Negative for chills and fever.  Respiratory: Positive for shortness of breath (resolved). Negative for cough.   Cardiovascular: Positive for chest pain. Negative for leg swelling.  Gastrointestinal: Negative for nausea and vomiting.  Musculoskeletal: Positive for arthralgias.  Neurological: Negative for dizziness, weakness, light-headedness and numbness.  All other systems reviewed and are negative.    Physical Exam Updated Vital Signs BP 128/79 (BP Location: Right Arm)   Pulse 72   Temp 98.8 F (37.1 C) (Oral)   Resp 16   Ht 5\' 3"  (1.6 m)   Wt 170 lb (77.1 kg)   LMP 05/04/2016   SpO2 100%   BMI 30.11 kg/m   Physical Exam  Constitutional: She is oriented to person, place, and time. She appears well-developed and well-nourished. No distress.  HENT:  Head: Normocephalic and atraumatic.  Eyes: Conjunctivae are normal.  Neck: Neck supple.  Cardiovascular: Normal rate, regular rhythm, normal heart sounds and intact distal pulses.   Pulmonary/Chest: Effort normal and breath  sounds normal. Tachypnea (initially) noted. No respiratory distress.  Abdominal: Soft. There is no tenderness. There is no guarding.  Musculoskeletal: She exhibits no edema or tenderness.  Full ROM in BUE. No increase in pain with palpation at the right shoulder, back, or chest   Lymphadenopathy:    She has no cervical adenopathy.  Neurological: She is alert and oriented to person, place, and time.  No sensory deficits Strength 5/5 bilaterally   Skin: Skin is warm and  dry. She is not diaphoretic.  Psychiatric: She has a normal mood and affect. Her behavior is normal.  Nursing note and vitals reviewed.    ED Treatments / Results  DIAGNOSTIC STUDIES: Oxygen Saturation is 100% on RA, normal by my interpretation.    COORDINATION OF CARE: 6:14 PM- Pt advised of plan for treatment and pt agrees. Pt will recieve D-dimer for further evaluation.    Labs (all labs ordered are listed, but only abnormal results are displayed) Labs Reviewed  BASIC METABOLIC PANEL - Abnormal; Notable for the following:       Result Value   BUN 5 (*)    All other components within normal limits  CBC WITH DIFFERENTIAL/PLATELET - Abnormal; Notable for the following:    Platelets 148 (*)    All other components within normal limits  D-DIMER, QUANTITATIVE (NOT AT [facility])    EKG  EKG Interpretation  Date/Time:  Monday May 18 2016 15:34:25 EDT Ventricular Rate:  86 PR Interval:  136 QRS Duration: 74 QT Interval:  358 QTC Calculation: 428 R Axis:   75 Text Interpretation:  Normal sinus rhythm with sinus arrhythmia Normal ECG No old tracing to compare Confirmed by Ethelda Chick  MD, SAM [phone]) on 05/18/2016 3:39:48 PM       Radiology Dg Chest 2 View  Result Date: 05/18/2016 CLINICAL DATA:  Right shoulder pain started 2 weeks ago. Constant heavy substernal chest pain shortness of breath. EXAM: CHEST  2 VIEW COMPARISON:  09/23/2015 FINDINGS: The heart size and mediastinal contours are within normal limits. Both lungs are clear. The visualized skeletal structures are unremarkable. IMPRESSION: No active cardiopulmonary disease. Electronically Signed   By: Norva Pavlov M.D.   On: 05/18/2016 18:46    Procedures Procedures (including critical care time)  Medications Ordered in ED Medications - No data to display   Initial Impression / Assessment and Plan / ED Course  Harolyn Rutherford, PA-C has reviewed the triage vital signs and the nursing notes.  Pertinent labs & imaging  results that were available during my care of the patient were reviewed by me and considered in my medical decision making (see chart for details).  Clinical Course    Ashley Tate presents with intermittent chest pain and shortness of breath beginning 2 weeks ago.  Need for PE rule out. D-dimer negative. Patient is nontoxic appearing, afebrile, not tachycardic, not tachypneic, not hypotensive, maintains SPO2 of 100% on room air, and is in no apparent distress. Patient has no signs of sepsis or other serious or life-threatening condition. Patient to follow up with PCP on this issue. The patient was given instructions for home care as well as return precautions. Patient voices understanding of these instructions, accepts the plan, and is comfortable with discharge.   Vitals:   05/18/16 1528 05/18/16 1730 05/18/16 1800 05/18/16 1830  BP: 128/79 107/68 111/66 105/64  Pulse: 72 68 67 67  Resp: Temp: 98.8 F (37.1 C)     TempSrc: Oral  SpO2: 100% 100% 100% 100%  Weight: 77.1 kg     Height: 5\' 3"  (1.6 m)        Final Clinical Impressions(s) / ED Diagnoses   Final diagnoses:  SOB (shortness of breath)   I personally performed the services described in this documentation, which was scribed in my presence. The recorded information has been reviewed and is accurate.   New Prescriptions Discharge Medication List as of 05/18/2016  6:52 PM       Anselm PancoastShawn C Joy, PA-C 05/19/16 1548    Doug SouSam Jacubowitz, MD 05/20/16 1044

## 2017-07-27 ENCOUNTER — Emergency Department (HOSPITAL_BASED_OUTPATIENT_CLINIC_OR_DEPARTMENT_OTHER): Payer: BLUE CROSS/BLUE SHIELD

## 2017-07-27 ENCOUNTER — Emergency Department (HOSPITAL_BASED_OUTPATIENT_CLINIC_OR_DEPARTMENT_OTHER)
Admission: EM | Admit: 2017-07-27 | Discharge: 2017-07-27 | Disposition: A | Discharge status: Home / Self Care | Payer: BLUE CROSS/BLUE SHIELD | Attending: Emergency Medicine | Admitting: Emergency Medicine

## 2017-07-27 ENCOUNTER — Encounter (HOSPITAL_BASED_OUTPATIENT_CLINIC_OR_DEPARTMENT_OTHER): Payer: Self-pay

## 2017-07-27 DIAGNOSIS — J45909 Unspecified asthma, uncomplicated: Secondary | ICD-10-CM | POA: Insufficient documentation

## 2017-07-27 DIAGNOSIS — R05 Cough: Secondary | ICD-10-CM | POA: Diagnosis present

## 2017-07-27 DIAGNOSIS — F17210 Nicotine dependence, cigarettes, uncomplicated: Secondary | ICD-10-CM | POA: Insufficient documentation

## 2017-07-27 DIAGNOSIS — J209 Acute bronchitis, unspecified: Secondary | ICD-10-CM | POA: Diagnosis not present

## 2017-07-27 DIAGNOSIS — Z79899 Other long term (current) drug therapy: Secondary | ICD-10-CM | POA: Insufficient documentation

## 2017-07-27 MED ORDER — PREDNISONE 20 MG PO TABS: 40.0000 mg | ORAL_TABLET | Freq: Every day | ORAL | 0 refills | Status: AC

## 2017-07-27 MED ORDER — ALBUTEROL SULFATE (2.5 MG/3ML) 0.083% IN NEBU: 2.5000 mg | INHALATION_SOLUTION | Freq: Four times a day (QID) | RESPIRATORY_TRACT | 0 refills | Status: DC | PRN

## 2017-07-27 MED ORDER — ALBUTEROL SULFATE HFA 108 (90 BASE) MCG/ACT IN AERS
2.0000 | INHALATION_SPRAY | Freq: Once | RESPIRATORY_TRACT | Status: AC
  Administered 2017-07-27: 2 via RESPIRATORY_TRACT
  Filled 2017-07-27: qty 6.7

## 2017-07-27 MED ORDER — PREDNISONE 50 MG PO TABS
60.0000 mg | ORAL_TABLET | Freq: Once | ORAL | Status: AC
  Administered 2017-07-27: 60 mg via ORAL
  Filled 2017-07-27: qty 1

## 2017-07-27 MED ORDER — ALBUTEROL SULFATE HFA 108 (90 BASE) MCG/ACT IN AERS: 2.0000 | INHALATION_SPRAY | Freq: Four times a day (QID) | RESPIRATORY_TRACT | 0 refills | Status: AC | PRN

## 2017-07-27 NOTE — ED Notes (Addendum)
Out of albuterol HFA, albuterol sol'n, & Duoneb sol'n.

## 2017-07-27 NOTE — ED Provider Notes (Signed)
[facility] EMERGENCY DEPARTMENT Provider Note   CSN: [id number] Arrival date & time: 07/27/17  1622     History   Chief Complaint Chief Complaint  Patient presents with  . Cough    HPI Ashley Tate is a 26 y.o. female.  HPI Patients at 26 year old female with ongoing tobacco abuse who also has a history of asthma who presents to emergency department with ongoing cough over the past 2 weeks. She ran out of her albuterol. She feels like she has bronchitis. She reports chills without documented fever. No significant exertional shortness of breath. Denies unilateral leg swelling. Symptoms are mild to moderate severity.   Past Medical History:  Diagnosis Date  . Asthma     There are no active problems to display for this patient.   History reviewed. No pertinent surgical history.  OB History    No data available       Home Medications    Prior to Admission medications   Medication Sig Start Date End Date Taking? Authorizing Provider  ipratropium-albuterol (DUONEB) 0.5-2.5 (3) MG/3ML SOLN Take 3 mLs by nebulization.   Yes [provider]  albuterol (PROVENTIL HFA;VENTOLIN HFA) 108 (90 Base) MCG/ACT inhaler Inhale 2 puffs into the lungs every 6 (six) hours as needed for wheezing or shortness of breath. 07/27/17   Azalia Bilis, MD  albuterol (PROVENTIL) (2.5 MG/3ML) 0.083% nebulizer solution Take 3 mLs (2.5 mg total) by nebulization every 6 (six) hours as needed for wheezing or shortness of breath. 07/27/17   Azalia Bilis, MD  predniSONE (DELTASONE) 20 MG tablet Take 2 tablets (40 mg total) by mouth daily. 07/27/17 08/01/17  Azalia Bilis, MD    Family History No family history on file.  Social History Social History  Substance Use Topics  . Smoking status: Current Every Day Smoker    Types: Cigarettes  . Smokeless tobacco: Never Used  . Alcohol use Yes     Comment: weekly     Allergies   Aspirin   Review of Systems Review of Systems   All other systems reviewed and are negative.    Physical Exam Updated Vital Signs BP 125/88 (BP Location: Left Arm)   Pulse 89   Temp 98.6 F (37 C) (Oral)   Resp 18   Ht  (1.6 m)   Wt 89.7 kg (197 lb 12 oz)   LMP 07/09/2017   SpO2 100%   BMI 35.03 kg/m   Physical Exam  Constitutional: She is oriented to person, place, and time. She appears well-developed and well-nourished.  HENT:  Head: Normocephalic.  Eyes: EOM are normal.  Neck: Normal range of motion.  Cardiovascular: Normal rate and regular rhythm.   Pulmonary/Chest: Effort normal. She has wheezes.  Abdominal: She exhibits no distension.  Musculoskeletal: Normal range of motion.  Neurological: She is alert and oriented to person, place, and time.  Psychiatric: She has a normal mood and affect.  Nursing note and vitals reviewed.    ED Treatments / Results  Labs (all labs ordered are listed, but only abnormal results are displayed) Labs Reviewed - No data to display  EKG  EKG Interpretation None       Radiology Dg Chest 2 View  Result Date: 07/27/2017 CLINICAL DATA:  Cough, shortness of breath on exertion for 3 weeks, asthma, smoker EXAM: CHEST  2 VIEW COMPARISON:  05/18/2016 FINDINGS: Normal heart size, mediastinal contours, and pulmonary vascularity. Lungs clear. No pleural effusion or pneumothorax. Bones unremarkable. IMPRESSION: Normal exam. Electronically  Signed   By: Ulyses Southward M.D.   On: 07/27/2017 17:57    Procedures Procedures (including critical care time)  Medications Ordered in ED Medications  predniSONE (DELTASONE) tablet 60 mg (60 mg Oral Given 07/27/17 1736)  albuterol (PROVENTIL HFA;VENTOLIN HFA) 108 (90 Base) MCG/ACT inhaler 2 puff (2 puffs Inhalation Given 07/27/17 1807)     Initial Impression / Assessment and Plan / ED Course  I have reviewed the triage vital signs and the nursing notes.  Pertinent labs & imaging results that were available during my care of the patient  were reviewed by me and considered in my medical decision making (see chart for details).     Feels much better after albuterol. Likely mild asthma exacerbations in the setting of acute bronchitis. Chest x-ray without infiltrate. Home with steroids and bronchodilators. Patient understands to return to the ER for new or worsening symptoms  Final Clinical Impressions(s) / ED Diagnoses   Final diagnoses:  Acute bronchitis with bronchospasm    New Prescriptions New Prescriptions   PREDNISONE (DELTASONE) 20 MG TABLET    Take 2 tablets (40 mg total) by mouth daily.     Azalia Bilis, MD 07/27/17 [phone]

## 2017-07-27 NOTE — ED Triage Notes (Signed)
C/o flu like sx x 3 weeks-worse x 1 week-NAD-steady gait

## 2018-04-18 LAB — OB RESULTS CONSOLE RUBELLA ANTIBODY, IGM: Rubella: IMMUNE

## 2018-04-18 LAB — OB RESULTS CONSOLE ABO/RH: RH TYPE: POSITIVE

## 2018-04-18 LAB — OB RESULTS CONSOLE HEPATITIS B SURFACE ANTIGEN: Hepatitis B Surface Ag: NEGATIVE

## 2018-04-18 LAB — OB RESULTS CONSOLE RPR: RPR: NONREACTIVE

## 2018-08-22 LAB — OB RESULTS CONSOLE HIV ANTIBODY (ROUTINE TESTING): HIV: NONREACTIVE

## 2018-10-12 NOTE — L&D Delivery Note (Signed)
Delivery Note At 11:04 PM a viable female was delivered via Vaginal, Spontaneous (Presentation: footling breech.  APGAR: 4,8 ; weight 6#7oz .   Placenta status: L&D Cord:  with the following complications: none.  Cord pH: n/a  Anesthesia:  epidural Episiotomy: None Lacerations: None Suture Repair: n/a Est. Blood Loss (mL): 50  Mom to postpartum.  Baby to Couplet care / Skin to Skin.  Sharon Seller 11/03/2018, 11:17 PM

## 2018-10-25 LAB — OB RESULTS CONSOLE GBS: GBS: NEGATIVE

## 2018-11-03 ENCOUNTER — Encounter (HOSPITAL_COMMUNITY): Payer: Self-pay

## 2018-11-03 ENCOUNTER — Inpatient Hospital Stay (HOSPITAL_COMMUNITY): Payer: BLUE CROSS/BLUE SHIELD | Admitting: Anesthesiology

## 2018-11-03 ENCOUNTER — Inpatient Hospital Stay (HOSPITAL_COMMUNITY)
Admission: AD | Admit: 2018-11-03 | Discharge: 2018-11-05 | DRG: 807 | Disposition: A | Discharge status: Home / Self Care | Payer: BLUE CROSS/BLUE SHIELD | Attending: Obstetrics & Gynecology | Admitting: Obstetrics & Gynecology

## 2018-11-03 ENCOUNTER — Other Ambulatory Visit: Payer: Self-pay

## 2018-11-03 DIAGNOSIS — Z3A37 37 weeks gestation of pregnancy: Secondary | ICD-10-CM | POA: Diagnosis not present

## 2018-11-03 DIAGNOSIS — Z3483 Encounter for supervision of other normal pregnancy, third trimester: Secondary | ICD-10-CM | POA: Diagnosis present

## 2018-11-03 DIAGNOSIS — O321XX0 Maternal care for breech presentation, not applicable or unspecified: Secondary | ICD-10-CM

## 2018-11-03 DIAGNOSIS — O99214 Obesity complicating childbirth: Secondary | ICD-10-CM | POA: Diagnosis present

## 2018-11-03 DIAGNOSIS — Z87891 Personal history of nicotine dependence: Secondary | ICD-10-CM | POA: Diagnosis not present

## 2018-11-03 DIAGNOSIS — O328XX0 Maternal care for other malpresentation of fetus, not applicable or unspecified: Secondary | ICD-10-CM | POA: Diagnosis present

## 2018-11-03 HISTORY — DX: Maternal care for breech presentation, not applicable or unspecified: O32.1XX0

## 2018-11-03 HISTORY — DX: Atypical squamous cells of undetermined significance on cytologic smear of vagina (ASC-US): R87.620

## 2018-11-03 LAB — CBC
HCT: 41.1 % (ref 36.0–46.0)
Hemoglobin: 13.6 g/dL (ref 12.0–15.0)
MCH: 29.9 pg (ref 26.0–34.0)
MCHC: 33.1 g/dL (ref 30.0–36.0)
MCV: 90.3 fL (ref 80.0–100.0)
Platelets: 158 10*3/uL (ref 150–400)
RBC: 4.55 MIL/uL (ref 3.87–5.11)
RDW: 13.6 % (ref 11.5–15.5)
WBC: 14.3 10*3/uL — ABNORMAL HIGH (ref 4.0–10.5)
nRBC: 0 % (ref 0.0–0.2)

## 2018-11-03 LAB — POCT FERN TEST: POCT Fern Test: POSITIVE

## 2018-11-03 LAB — TYPE AND SCREEN
ABO/RH(D): A POS
Antibody Screen: NEGATIVE

## 2018-11-03 MED ORDER — OXYTOCIN BOLUS FROM INFUSION
500.0000 mL | Freq: Once | INTRAVENOUS | Status: AC
  Administered 2018-11-03: 500 mL via INTRAVENOUS

## 2018-11-03 MED ORDER — LACTATED RINGERS IV SOLN: 500.0000 mL | INTRAVENOUS | Status: DC | PRN

## 2018-11-03 MED ORDER — PHENYLEPHRINE 40 MCG/ML (10ML) SYRINGE FOR IV PUSH (FOR BLOOD PRESSURE SUPPORT)
80.0000 ug | PREFILLED_SYRINGE | INTRAVENOUS | Status: DC | PRN
  Filled 2018-11-03: qty 10

## 2018-11-03 MED ORDER — LACTATED RINGERS IV SOLN
INTRAVENOUS | Status: DC
  Administered 2018-11-03: 22:00:00 via INTRAVENOUS

## 2018-11-03 MED ORDER — PHENYLEPHRINE 40 MCG/ML (10ML) SYRINGE FOR IV PUSH (FOR BLOOD PRESSURE SUPPORT)
PREFILLED_SYRINGE | INTRAVENOUS | Status: AC
  Filled 2018-11-03: qty 10

## 2018-11-03 MED ORDER — TERBUTALINE SULFATE 1 MG/ML IJ SOLN
0.2500 mg | Freq: Once | INTRAMUSCULAR | Status: DC | PRN
  Filled 2018-11-03: qty 1

## 2018-11-03 MED ORDER — SOD CITRATE-CITRIC ACID 500-334 MG/5ML PO SOLN: 30.0000 mL | ORAL | Status: DC | PRN

## 2018-11-03 MED ORDER — FENTANYL 2.5 MCG/ML BUPIVACAINE 1/10 % EPIDURAL INFUSION (WH - ANES)
INTRAMUSCULAR | Status: AC
  Filled 2018-11-03: qty 100

## 2018-11-03 MED ORDER — FENTANYL 2.5 MCG/ML BUPIVACAINE 1/10 % EPIDURAL INFUSION (WH - ANES)
14.0000 mL/h | INTRAMUSCULAR | Status: DC | PRN
  Administered 2018-11-03: 14 mL/h via EPIDURAL

## 2018-11-03 MED ORDER — LIDOCAINE HCL (PF) 1 % IJ SOLN
30.0000 mL | INTRAMUSCULAR | Status: DC | PRN
  Filled 2018-11-03: qty 30

## 2018-11-03 MED ORDER — ACETAMINOPHEN 325 MG PO TABS: 650.0000 mg | ORAL_TABLET | ORAL | Status: DC | PRN

## 2018-11-03 MED ORDER — EPHEDRINE 5 MG/ML INJ
10.0000 mg | INTRAVENOUS | Status: DC | PRN
  Filled 2018-11-03: qty 2

## 2018-11-03 MED ORDER — OXYTOCIN 40 UNITS IN NORMAL SALINE INFUSION - SIMPLE MED
1.0000 m[IU]/min | INTRAVENOUS | Status: DC
  Administered 2018-11-03: 2 m[IU]/min via INTRAVENOUS

## 2018-11-03 MED ORDER — ONDANSETRON HCL 4 MG/2ML IJ SOLN: 4.0000 mg | Freq: Four times a day (QID) | INTRAMUSCULAR | Status: DC | PRN

## 2018-11-03 MED ORDER — LACTATED RINGERS IV SOLN
500.0000 mL | Freq: Once | INTRAVENOUS | Status: AC
  Administered 2018-11-03: 500 mL via INTRAVENOUS

## 2018-11-03 MED ORDER — OXYCODONE-ACETAMINOPHEN 5-325 MG PO TABS: 1.0000 | ORAL_TABLET | ORAL | Status: DC | PRN

## 2018-11-03 MED ORDER — OXYCODONE-ACETAMINOPHEN 5-325 MG PO TABS: 2.0000 | ORAL_TABLET | ORAL | Status: DC | PRN

## 2018-11-03 MED ORDER — LIDOCAINE-EPINEPHRINE (PF) 2 %-1:200000 IJ SOLN
INTRAMUSCULAR | Status: DC | PRN
  Administered 2018-11-03: 3 mL via EPIDURAL
  Administered 2018-11-03: 4 mL via EPIDURAL

## 2018-11-03 MED ORDER — OXYTOCIN 40 UNITS IN NORMAL SALINE INFUSION - SIMPLE MED
2.5000 [IU]/h | INTRAVENOUS | Status: DC
  Filled 2018-11-03: qty 1000

## 2018-11-03 MED ORDER — DIPHENHYDRAMINE HCL 50 MG/ML IJ SOLN: 12.5000 mg | INTRAMUSCULAR | Status: DC | PRN

## 2018-11-03 NOTE — H&P (Addendum)
Ashley Tate is a 28 y.o. female, G3P1011 at 37+6 weeks, presenting for SROM at 1900, C fluid with contractions. Low risk pregnancy. On examination in MAU, pt found to be breech, 9/100//0, confirmed by [place]. Dr. Charlotta Newton called in from home and Dr. Shawnie Pons to [facility]. Pt relocated to L&D. See MD note regarding delivery plan.  Patient Active Problem List   Diagnosis Date Noted  . Breech presentation 11/03/2018  . Normal labor 11/03/2018    History of present pregnancy: Patient entered care at 12 weeks.   EDC of 11/18/18 was established by 9wk [place].   Anatomy scan:  20 weeks, with normal findings and a posterior placenta.   Additional [place] evaluations:  Completion of anatomy   Significant prenatal events:  none   Last evaluation:  1/23//20 ROB  OB History    Gravida  3   Para  1   Term  1   Preterm  0   AB  1   Living  1     SAB  1   TAB  0   Ectopic  0   Multiple  0   Live Births  1          Past Medical History:  Diagnosis Date  . Asthma    Past Surgical History:  Procedure Laterality Date  . NO PAST SURGERIES     Family History: family history is not on file. Social History:  reports that she has quit smoking. Her smoking use included cigarettes. She has never used smokeless tobacco. She reports previous alcohol use. She reports that she does not use drugs.  Vitals:   11/03/18 2111  BP: 124/70  Pulse: 86  Resp: 18  Temp: 98.5 F (36.9 C)  TempSrc: Oral  Weight: 109.8 kg  Height: 5\' 3"  (1.6 m)   Results for orders placed or performed during the hospital encounter of 11/03/18 (from the past 24 hour(s))  POCT fern test     Status: None   Collection Time: 11/03/18  8:43 PM  Result Value Ref Range   POCT Fern Test Positive = ruptured amniotic membanes   CBC     Status: Abnormal   Collection Time: 11/03/18  9:07 PM  Result Value Ref Range   WBC 14.3 (H) 4.0 - 10.5 K/uL   RBC 4.55 3.87 - 5.11 MIL/uL   Hemoglobin 13.6 12.0 - 15.0 g/dL   HCT 09.6 28.3 - 66.2 %   MCV 90.3 80.0 - 100.0 fL   MCH 29.9 26.0 - 34.0 pg   MCHC 33.1 30.0 - 36.0 g/dL   RDW 94.7 65.4 - 65.0 %   Platelets 158 150 - 400 K/uL   nRBC 0.0 0.0 - 0.2 %     Prenatal Transfer Tool  Maternal Diabetes: No Genetic Screening: Normal Maternal Ultrasounds/Referrals: Normal Fetal Ultrasounds or other Referrals:  None Maternal Substance Abuse:  No Significant Maternal Medications:  None Significant Maternal Lab Results: Lab values include: Group B Strep negative  TDAP yes Flu yes  ROS:  All ten systems reviewed and negative, except as noted. Denies VB.  Denies headache, epigatrsic pain, and visual sx. Reports good FM.   Allergies  Allergen Reactions  . Aspirin      Dilation: 9 Effacement (%): 100 Station: 0 Exam by:: Ashley Tate cnm There were no vitals taken for this visit.  Chest clear Heart RRR without murmur Abd gravid, NT Pelvic: normal vulva/vagina Ext: No signs or symptoms of DVT  FHR: .baseline 150, NICHD  category 1, moderate variability, accels present, decels absent. UCs:  q 2-3 minutes per pt, not tracing well (maternal habitus)  Prenatal labs: ABO, Rh:  A+ Antibody:  neg Rubella:   Imm RPR:   NR HBsAg:   NR HIV:   NR GBS:  neg Sickle cell/Hgb electrophoresis:  AA GC:  neg Chlamydia:  neg Genetic screenings:  declined Glucola:  122 Hgb 13.1 at NOB, 11.6 at 28 weeks    Assessment: 28 y.o., G3P1011 at 37+6 weeks Breech Cat 1  [facility]: [facility] with Drs. Shawnie PonsPratt and Ozan (present on unit, both to attend birth) Epidural SVD Routine [facility] orders   Ashley SpeakCarol Lilyrose Tate, CNM 11/03/2018, 9:14 PM

## 2018-11-03 NOTE — MAU Note (Signed)
Bedside US performed by Wynelle Bourgeois CNM and noted that the babies head was in the RUQ.

## 2018-11-03 NOTE — Anesthesia Procedure Notes (Signed)
Epidural Patient location during procedure: OB Start time: 11/03/2018 9:20 PM End time: 11/03/2018 9:40 PM  Staffing Anesthesiologist: Elmer Picker, MD Performed: anesthesiologist   Preanesthetic Checklist Completed: patient identified, pre-op evaluation, timeout performed, IV checked, risks and benefits discussed and monitors and equipment checked  Epidural Patient position: sitting Prep: site prepped and draped and DuraPrep Patient monitoring: continuous pulse ox, blood pressure, heart rate and cardiac monitor Approach: midline Location: L3-L4 Injection technique: LOR air  Needle:  Needle type: Tuohy  Needle gauge: 17 G Needle length: 9 cm Needle insertion depth: 8 cm Catheter type: closed end flexible Catheter size: 19 Gauge Catheter at skin depth: 14 cm Test dose: negative  Assessment Sensory level: T8 Events: blood not aspirated, injection not painful, no injection resistance, negative IV test and no paresthesia  Additional Notes Patient identified. Risks/Benefits/Options discussed with patient including but not limited to bleeding, infection, nerve damage, paralysis, failed block, incomplete pain control, headache, blood pressure changes, nausea, vomiting, reactions to medication both or allergic, itching and postpartum back pain. Confirmed with bedside nurse the patient's most recent platelet count. Confirmed with patient that they are not currently taking any anticoagulation, have any bleeding history or any family history of bleeding disorders. Patient expressed understanding and wished to proceed. All questions were answered. Sterile technique was used throughout the entire procedure. Please see nursing notes for vital signs. Test dose was given through epidural catheter and negative prior to continuing to dose epidural or start infusion. Warning signs of high block given to the patient including shortness of breath, tingling/numbness in hands, complete motor block,  or any concerning symptoms with instructions to call for help. Patient was given instructions on fall risk and not to get out of bed. All questions and concerns addressed with instructions to call with any issues or inadequate analgesia.  Reason for block:procedure for pain

## 2018-11-03 NOTE — Progress Notes (Signed)
At bedside to discuss management options.  Breech presentation noted on [place] by MAU with advanced cervical dilation of 9cm with regular painful contractions.  Discussed risk/benefit of vaginal vs c-section delivery including potential for head entrapment and/or possible stat C-section.  Pt aware and wishes to proceed with vaginal delivery.  Myna Hidalgo, DO [phone] (cell) [phone] (office)

## 2018-11-03 NOTE — MAU Note (Signed)
Pt states water broke 1900-clear. Membranes stripped at 1630. Pt reports contractions 4-5 mins. Denies vaginal bleeding. Good fetal movement. Cervix was 1cm today

## 2018-11-03 NOTE — Anesthesia Preprocedure Evaluation (Signed)
Anesthesia Evaluation  Patient identified by MRN, date of birth, ID band Patient awake    Reviewed: Allergy & Precautions, NPO status , Patient's Chart, lab work & pertinent test results  Airway Mallampati: II  TM Distance: >3 FB Neck ROM: Full    Dental no notable dental hx. (+) Teeth Intact, Dental Advisory Given   Pulmonary asthma , former smoker,    Pulmonary exam normal breath sounds clear to auscultation       Cardiovascular negative cardio ROS Normal cardiovascular exam Rhythm:Regular Rate:Normal     Neuro/Psych negative neurological ROS  negative psych ROS   GI/Hepatic negative GI ROS, Neg liver ROS,   Endo/Other  Morbid obesity  Renal/GU negative Renal ROS  negative genitourinary   Musculoskeletal negative musculoskeletal ROS (+)   Abdominal   Peds  Hematology negative hematology ROS (+)   Anesthesia Other Findings   Reproductive/Obstetrics (+) Pregnancy                             Anesthesia Physical Anesthesia Plan  ASA: III  Anesthesia Plan: Epidural   Post-op Pain Management:    Induction:   PONV Risk Score and Plan: Treatment may vary due to age or medical condition  Airway Management Planned: Natural Airway  Additional Equipment:   Intra-op Plan:   Post-operative Plan:   Informed Consent: I have reviewed the patients History and Physical, chart, labs and discussed the procedure including the risks, benefits and alternatives for the proposed anesthesia with the patient or authorized representative who has indicated his/her understanding and acceptance.       Plan Discussed with: Anesthesiologist  Anesthesia Plan Comments: (Patient identified. Risks, benefits, options discussed with patient including but not limited to bleeding, infection, nerve damage, paralysis, failed block, incomplete pain control, headache, blood pressure changes, nausea, vomiting,  reactions to medication, itching, and post partum back pain. Confirmed with bedside nurse the patient's most recent platelet count. Confirmed with the patient that they are not taking any anticoagulation, have any bleeding history or any family history of bleeding disorders. Patient expressed understanding and wishes to proceed. All questions were answered. )        Anesthesia Quick Evaluation

## 2018-11-04 ENCOUNTER — Encounter (HOSPITAL_COMMUNITY): Payer: Self-pay

## 2018-11-04 LAB — CBC
HCT: 38.3 % (ref 36.0–46.0)
Hemoglobin: 12.8 g/dL (ref 12.0–15.0)
MCH: 30.2 pg (ref 26.0–34.0)
MCHC: 33.4 g/dL (ref 30.0–36.0)
MCV: 90.3 fL (ref 80.0–100.0)
PLATELETS: 129 10*3/uL — AB (ref 150–400)
RBC: 4.24 MIL/uL (ref 3.87–5.11)
RDW: 13.7 % (ref 11.5–15.5)
WBC: 14.9 10*3/uL — ABNORMAL HIGH (ref 4.0–10.5)
nRBC: 0 % (ref 0.0–0.2)

## 2018-11-04 LAB — ABO/RH: ABO/RH(D): A POS

## 2018-11-04 MED ORDER — ACETAMINOPHEN 325 MG PO TABS: 650.0000 mg | ORAL_TABLET | ORAL | Status: DC | PRN

## 2018-11-04 MED ORDER — WITCH HAZEL-GLYCERIN EX PADS: 1.0000 "application " | MEDICATED_PAD | CUTANEOUS | Status: DC | PRN

## 2018-11-04 MED ORDER — ZOLPIDEM TARTRATE 5 MG PO TABS: 5.0000 mg | ORAL_TABLET | Freq: Every evening | ORAL | Status: DC | PRN

## 2018-11-04 MED ORDER — TETANUS-DIPHTH-ACELL PERTUSSIS 5-2.5-18.5 LF-MCG/0.5 IM SUSP: 0.5000 mL | Freq: Once | INTRAMUSCULAR | Status: DC

## 2018-11-04 MED ORDER — COCONUT OIL OIL: 1.0000 "application " | TOPICAL_OIL | Status: DC | PRN

## 2018-11-04 MED ORDER — SENNOSIDES-DOCUSATE SODIUM 8.6-50 MG PO TABS
2.0000 | ORAL_TABLET | ORAL | Status: DC
  Administered 2018-11-05: 2 via ORAL
  Filled 2018-11-04: qty 2

## 2018-11-04 MED ORDER — IBUPROFEN 600 MG PO TABS
600.0000 mg | ORAL_TABLET | Freq: Four times a day (QID) | ORAL | Status: DC
  Administered 2018-11-04 – 2018-11-05 (×6): 600 mg via ORAL
  Filled 2018-11-04 (×6): qty 1

## 2018-11-04 MED ORDER — PRENATAL MULTIVITAMIN CH
1.0000 | ORAL_TABLET | Freq: Every day | ORAL | Status: DC
  Administered 2018-11-04 – 2018-11-05 (×2): 1 via ORAL
  Filled 2018-11-04 (×2): qty 1

## 2018-11-04 MED ORDER — BENZOCAINE-MENTHOL 20-0.5 % EX AERO: 1.0000 "application " | INHALATION_SPRAY | CUTANEOUS | Status: DC | PRN

## 2018-11-04 MED ORDER — DIPHENHYDRAMINE HCL 25 MG PO CAPS: 25.0000 mg | ORAL_CAPSULE | Freq: Four times a day (QID) | ORAL | Status: DC | PRN

## 2018-11-04 MED ORDER — IBUPROFEN 600 MG PO TABS: 600.0000 mg | ORAL_TABLET | Freq: Four times a day (QID) | ORAL | 0 refills | Status: DC

## 2018-11-04 MED ORDER — SIMETHICONE 80 MG PO CHEW: 80.0000 mg | CHEWABLE_TABLET | ORAL | Status: DC | PRN

## 2018-11-04 MED ORDER — ONDANSETRON HCL 4 MG PO TABS: 4.0000 mg | ORAL_TABLET | ORAL | Status: DC | PRN

## 2018-11-04 MED ORDER — DIBUCAINE 1 % RE OINT: 1.0000 "application " | TOPICAL_OINTMENT | RECTAL | Status: DC | PRN

## 2018-11-04 MED ORDER — ONDANSETRON HCL 4 MG/2ML IJ SOLN: 4.0000 mg | INTRAMUSCULAR | Status: DC | PRN

## 2018-11-04 NOTE — Anesthesia Postprocedure Evaluation (Signed)
Anesthesia Post Note  Patient: Ashley Tate  Procedure(s) Performed: AN AD HOC LABOR EPIDURAL     Patient location during evaluation: Mother Baby Anesthesia Type: Epidural Level of consciousness: awake Pain management: pain level controlled Vital Signs Assessment: post-procedure vital signs reviewed and stable Respiratory status: spontaneous breathing Cardiovascular status: stable Postop Assessment: patient able to bend at knees and epidural receding Anesthetic complications: no    Last Vitals:  Vitals:   11/04/18 0200 11/04/18 0600  BP: 118/64 123/76  Pulse: 92 92  Resp: 16 17  Temp: 37 C 36.8 C  SpO2: 98% 98%    Last Pain:  Vitals:   11/04/18 0600  TempSrc: Oral  PainSc: 2    Pain Goal:                   [name]

## 2018-11-04 NOTE — Lactation Note (Signed)
This note was copied from a baby's chart. Lactation Consultation Note  Patient Name: Ashley Tate [id number] Date: 11/04/2018  ETI vaginal delivery with bruising.  Mom reports she is not breastfeeding very well, so she is pumping and formula feeding past breastfeedings. Mom reports she feels like they are doing well and denies need for assistance from lactation.  Praised moms breastfeeding efforts.  Urged her to call lactation as needed.   Maternal Data    Feeding Feeding Type: Bottle Fed - Formula Nipple Type: Slow - flow  LATCH Score                   Interventions    Lactation Tools Discussed/Used     Consult Status      Ashley Tate 11/04/2018, 4:46 PM

## 2018-11-04 NOTE — Discharge Summary (Addendum)
SVD OB Discharge Summary     Patient Name: Ashley PesaLeather Girten DOB: 03/13/1991 MRN: [id number]  Date of admission: 11/03/2018 Delivering MD: Reva BoresPRATT, TANYA S  Date of delivery: 11/03/2018 Type of delivery: SVD Breech  Newborn Data: Sex: Baby female  Live born female  Birth Weight: 6 lb 7.9 oz (2945 g) APGAR: 4, 8  Newborn Delivery   Birth date/time:  11/03/2018 23:04:00 Delivery type:  Vaginal, Spontaneous     Feeding: breast and bottle Infant being discharge to home with mother in stable condition.   Admitting diagnosis: 37wks water broke ctx Intrauterine pregnancy: 2813w6d     Secondary diagnosis:  Active Problems:   Breech presentation delivered   Normal postpartum course                                Complications: None                                                              Intrapartum Procedures: breech extraction Postpartum Procedures: none Complications-Operative and Postpartum: none Augmentation: None   History of Present Illness: Ms. Ashley Tate is a 28 y.o. female, [id number], who presents at 2813w6d weeks gestation. The patient [facility]  Her pregnancy has been complicated by:  Patient Active Problem List   Diagnosis Date Noted  . Normal postpartum course 11/04/2018  . Breech presentation delivered 11/03/2018   Hospital course:  Onset of Labor With Vaginal Delivery     28 y.o. yo [id number] at 9213w6d was admitted in Active Labor on 11/03/2018. Patient had an uncomplicated labor course as follows:  Membrane Rupture Time/Date: 7:00 PM ,11/03/2018   Intrapartum Procedures: Episiotomy: None [1]                                         Lacerations:  None [1]  Patient had a delivery of a Viable infant. 11/03/2018  Information for the patient's newborn:  Salomon FickBanks, Girl Darrol AngelLeather [[id number]][[id number]]       Pateint had an uncomplicated postpartum course.  She is ambulating, tolerating a regular diet, passing flatus, and  urinating well. Patient is discharged home in stable condition on 11/04/18.  Postpartum Day # 1 : S/P NSVD due to breech, spontaneous labor arrived in MAY in advanced dilation. Patient up ad lib, denies syncope or dizziness. Reports consuming regular diet without issues and denies N/V. Patient reports 0 bowel movement + passing flatus.  Denies issues with urination and reports bleeding is "lighter."  Patient is breast and bottle feeding and reports going well.  Desires Nexplanon or IUD for postpartum contraception.  Pain is being appropriately managed with use of po meds. Pt would like early discharge at 2300 tonigh after infant 24 hour testing, pt stable, but needs to go home to watch her 30 year old, due pt child not allowed in hospital due to flu, pt adamant, about going home. Post delivery Hgb was 12   Physical exam  Vitals:   11/04/18 0016 11/04/18 0050 11/04/18 0200 11/04/18 0600  BP: 122/64 124/68 118/64 123/76  Pulse: 92 90 92 92  Resp: 16 17 16 17   Temp:  98.6 F (37 C) 98.6 F (37 C) 98.2 F (36.8 C)  TempSrc:  Oral Oral Oral  SpO2:  98% 98% 98%  Weight:      Height:       General: alert, cooperative and no distress Lochia: appropriate Uterine Fundus: firm Perineum: Intact  DVT Evaluation: No evidence of DVT seen on physical exam. Negative Homan's sign. No cords or calf tenderness. No significant calf/ankle edema.  Labs: Lab Results  Component Value Date   WBC 14.9 (H) 11/04/2018   HGB 12.8 11/04/2018   HCT 38.3 11/04/2018   MCV 90.3 11/04/2018   PLT 129 (L) 11/04/2018   CMP Latest Ref Rng & Units 05/18/2016  Glucose 65 - 99 mg/dL 85  BUN 6 - 20 mg/dL 5(L)  Creatinine 3.36 - 1.00 mg/dL 1.22  Sodium 449 - 753 mmol/L 137  Potassium 3.5 - 5.1 mmol/L 3.6  Chloride 101 - 111 mmol/L 105  CO2 22 - 32 mmol/L 24  Calcium 8.9 - 10.3 mg/dL 9.0    Date of discharge: 11/04/2018 Discharge Diagnoses: Term Pregnancy-delivered Discharge instruction: per After Visit Summary and  "Baby and Me Booklet".  After visit meds:  Allergies as of 11/04/2018      Reactions   Aspirin       Medication List    TAKE these medications   albuterol 108 (90 Base) MCG/ACT inhaler Commonly known as:  PROVENTIL HFA;VENTOLIN HFA Inhale 2 puffs into the lungs every 6 (six) hours as needed for wheezing or shortness of breath.   albuterol (2.5 MG/3ML) 0.083% nebulizer solution Commonly known as:  PROVENTIL Take 3 mLs (2.5 mg total) by nebulization every 6 (six) hours as needed for wheezing or shortness of breath.   ibuprofen 600 MG tablet Commonly known as:  ADVIL,MOTRIN Take 1 tablet (600 mg total) by mouth every 6 (six) hours.   ipratropium-albuterol 0.5-2.5 (3) MG/3ML Soln Commonly known as:  DUONEB Take 3 mLs by nebulization.   prenatal multivitamin Tabs tablet Take 1 tablet by mouth daily at 12 noon.       Activity:           unrestricted and pelvic rest Advance as tolerated. Pelvic rest for 6 weeks.  Diet:                routine Medications: PNV and Ibuprofen Postpartum contraception: Nexplanon and IUD Mirena Condition:  Pt discharge to home with baby in stable condition   Meds: Allergies as of 11/04/2018      Reactions   Aspirin       Medication List    TAKE these medications   albuterol 108 (90 Base) MCG/ACT inhaler Commonly known as:  PROVENTIL HFA;VENTOLIN HFA Inhale 2 puffs into the lungs every 6 (six) hours as needed for wheezing or shortness of breath.   albuterol (2.5 MG/3ML) 0.083% nebulizer solution Commonly known as:  PROVENTIL Take 3 mLs (2.5 mg total) by nebulization every 6 (six) hours as needed for wheezing or shortness of breath.   ibuprofen 600 MG tablet Commonly known as:  ADVIL,MOTRIN Take 1 tablet (600 mg total) by mouth every 6 (six) hours.   ipratropium-albuterol 0.5-2.5 (3) MG/3ML Soln Commonly known as:  DUONEB Take 3 mLs by nebulization.   prenatal multivitamin Tabs tablet Take 1 tablet by mouth daily at 12 noon.        Discharge Follow Up:  Follow-up Information    [facility] Follow up  in 6 week(s).   Specialty:  Obstetrics and Gynecology Contact information: [address] [place] [address] [phone]           [place], NP-C, CNM 11/04/2018, 8:29 AM  Dale [place], FNP   [place] @ 1800 on 11/04/2018, Rn called and reported Peds is keeping the newborn for bruised faced with increased risk of hyperbilirubinemia, mother upset and was set on going home at 2300, Pt family now in town mother more calm and ojm to stay until tomorrow , Discharge order canceled.

## 2018-11-05 LAB — RPR: RPR Ser Ql: NONREACTIVE

## 2018-11-05 NOTE — Lactation Note (Signed)
This note was copied from a baby's chart. Lactation Consultation Note  Patient Name: Ashley Tate [id number] Date: 11/05/2018 Reason for consult: Follow-up assessment;Hyperbilirubinemia;Early term 30-38.6wks  Visited with P2 Mom of ET infant at 67 hrs old.  To discharge Mom but baby to stay another day due to elevated bilirubin. Baby was a footling breech, vaginal delivery.  Bruising noted of leg.    Mom has struggled to get baby to breastfeed more than 5-10 mins.  Baby started being supplemented with formula by slow flow bottle, and Mom was set up with a DEBP yesterday.  Offered assistance, but Mom declined saying she knows what she needs to do.  She is focused on feeding baby at least 30 ml at each feeding, knows to awaken baby at least every 3 hrs.    Encouraged double pumping with each feeding.  Encouraged STS as much as possible.  Encouraged Mom to sit in bright sunny window with baby also.    Mom has 2 DEBPs at home.   Encouraged Mom to call prn for assistance.  Repeat TCB this afternoon.     Interventions Interventions: Breast feeding basics reviewed;Skin to skin;Breast massage;Hand express;Shells;Support pillows;DEBP;Hand pump  Lactation Tools Discussed/Used Tools: Pump;Shells;Bottle Shell Type: Inverted Breast pump type: Double-Electric Breast Pump WIC Program: No Pump Review: Setup, frequency, and cleaning;Milk Storage Initiated by:: RN Date initiated:: 11/04/18   Consult Status Consult Status: Follow-up Date: 11/06/18 Follow-up type: In-patient    [name] 11/05/2018, 12:07 PM

## 2018-11-05 NOTE — Discharge Summary (Signed)
[facility] Discharge Summary     Patient Name: Ashley Tate DOB: [birth year] MRN: [id number]  Date of admission: 11/03/2018 Delivering MD: Reva Bores   Date of discharge: 11/05/2018  Admitting diagnosis: 37wks water broke ctx Intrauterine pregnancy: [redacted]w[redacted]d     Secondary diagnosis:  Active Problems:   Breech presentation delivered   Normal postpartum course  Additional problems: None     Discharge diagnosis: Term Pregnancy Delivered                                                                                                Post partum procedures:None  Augmentation: None  Complications: None  Hospital course:  Onset of Labor With Vaginal Delivery     28 y.o. yo [id number] at [redacted]w[redacted]d was admitted in Active Labor on 11/03/2018. Patient had an uncomplicated labor course as follows:  Membrane Rupture Time/Date: 7:00 PM ,11/03/2018   Intrapartum Procedures: Episiotomy: None [1]                                         Lacerations:  None [1]  Patient had a delivery of a Viable infant. 11/03/2018  Information for the patient's newborn:  Ashley, See Girl Tate [[id number]]       Pateint had an uncomplicated postpartum course.  She is ambulating, tolerating a regular diet, passing flatus, and urinating well. Patient is discharged home in stable condition on 11/05/18.   Physical exam  Vitals:   11/04/18 1155 11/04/18 1520 11/04/18 2229 11/05/18 0608  BP: 132/73 116/66 (!) 105/55 109/65  Pulse: 90 87 81 75  Resp: 18 18 18 16   Temp: (!) 97.3 F (36.3 C) 98 F (36.7 C) 98 F (36.7 C) (!) 97.5 F (36.4 C)  TempSrc: Oral Oral Oral Oral  SpO2:    99%  Weight:      Height:       General: alert, cooperative and no distress Lochia: appropriate Uterine Fundus: firm Incision: N/A DVT Evaluation: No evidence of DVT seen on physical exam. Labs: Lab Results  Component Value Date   WBC 14.9 (H) 11/04/2018   HGB 12.8 11/04/2018   HCT 38.3 11/04/2018   MCV 90.3 11/04/2018   PLT 129 (L)  11/04/2018   CMP Latest Ref Rng & Units 05/18/2016  Glucose 65 - 99 mg/dL 85  BUN 6 - 20 mg/dL 5(L)  Creatinine 8.28 - 1.00 mg/dL 0.03  Sodium 491 - 791 mmol/L 137  Potassium 3.5 - 5.1 mmol/L 3.6  Chloride 101 - 111 mmol/L 105  CO2 22 - 32 mmol/L 24  Calcium 8.9 - 10.3 mg/dL 9.0    Discharge instruction: per After Visit Summary and "Baby and Me Booklet".  After visit meds:  Allergies as of 11/05/2018      Reactions   Aspirin       Medication List    TAKE these medications   albuterol 108 (90 Base) MCG/ACT inhaler Commonly known as:  PROVENTIL HFA;VENTOLIN HFA Inhale 2 puffs into the lungs  every 6 (six) hours as needed for wheezing or shortness of breath.   ibuprofen 600 MG tablet Commonly known as:  ADVIL,MOTRIN Take 1 tablet (600 mg total) by mouth every 6 (six) hours.   prenatal multivitamin Tabs tablet Take 1 tablet by mouth daily at 12 noon.       Diet: routine diet  Activity: Advance as tolerated. Pelvic rest for 6 weeks.   Outpatient follow up:6 weeks Follow up Appt:No future appointments. Follow up Visit:No follow-ups on file.  Postpartum contraception: IUD Mirena  Newborn Data: Live born female  Birth Weight: 6 lb 7.9 oz (2945 g) APGAR: 4, 8  Newborn Delivery   Birth date/time:  11/03/2018 23:04:00 Delivery type:  Vaginal, Spontaneous     Baby Feeding: Bottle and Breast Disposition:home with mother   11/05/2018 Ashley Tate, CNM

## 2018-11-06 ENCOUNTER — Ambulatory Visit: Payer: Self-pay

## 2018-11-06 NOTE — Lactation Note (Signed)
This note was copied from a baby's chart. Lactation Consultation Note  Patient Name: Girl Star Primer [id number] Date: 11/06/2018 Reason for consult: Follow-up assessment;Hyperbilirubinemia;Early term 63-38.6wks  Visited with P2 Mom of ET infant on day of possible discharge.  Baby is at 4% weight loss at 58 hrs old.  Mom is exclusively formula feeding by bottle, due to "sleepiness at the breast".  Mom's desire is to "get her through this jaundice period" before she is breastfeeding again.  Mom denies needing any assistance with her feeding plan.  Mom desires follow-up after discharge.  Request made to Clinic for OP lactation appointment.    Encouraged STS and feeding baby at least every 3 hrs.  Volume to increase to 30-60 ml per feeding.  Baby is taking 30 ml currently.   Bilirubin did increase to 13 this am.   Encouraged double pumping to support a full milk supply.  Recommended latching baby prior to bottle feeding.    Engorgement prevention and treatment reviewed.  Mom encouraged to call prn for concerns after discharge.   Interventions Interventions: Breast feeding basics reviewed;Skin to skin;Breast massage;Hand express;DEBP  Lactation Tools Discussed/Used Tools: Pump;Bottle Shell Type: Inverted Breast pump type: Double-Electric Breast Pump   Consult Status Consult Status: Complete Date: 11/06/18 Follow-up type: Out-patient    [name] 11/06/2018, 9:07 AM

## 2019-06-30 ENCOUNTER — Encounter (HOSPITAL_COMMUNITY): Payer: Self-pay

## 2019-06-30 ENCOUNTER — Other Ambulatory Visit: Payer: Self-pay

## 2019-06-30 ENCOUNTER — Emergency Department (HOSPITAL_COMMUNITY): Payer: BC Managed Care – PPO

## 2019-06-30 ENCOUNTER — Emergency Department (HOSPITAL_COMMUNITY)
Admission: EM | Admit: 2019-06-30 | Discharge: 2019-06-30 | Disposition: A | Discharge status: Home / Self Care | Payer: BC Managed Care – PPO | Attending: Emergency Medicine | Admitting: Emergency Medicine

## 2019-06-30 DIAGNOSIS — J45909 Unspecified asthma, uncomplicated: Secondary | ICD-10-CM | POA: Diagnosis not present

## 2019-06-30 DIAGNOSIS — Z79899 Other long term (current) drug therapy: Secondary | ICD-10-CM | POA: Insufficient documentation

## 2019-06-30 DIAGNOSIS — Z87891 Personal history of nicotine dependence: Secondary | ICD-10-CM | POA: Diagnosis not present

## 2019-06-30 DIAGNOSIS — K641 Second degree hemorrhoids: Secondary | ICD-10-CM | POA: Diagnosis not present

## 2019-06-30 DIAGNOSIS — K625 Hemorrhage of anus and rectum: Secondary | ICD-10-CM

## 2019-06-30 DIAGNOSIS — K922 Gastrointestinal hemorrhage, unspecified: Secondary | ICD-10-CM | POA: Diagnosis present

## 2019-06-30 LAB — CBC WITH DIFFERENTIAL/PLATELET
Abs Immature Granulocytes: 0.02 10*3/uL (ref 0.00–0.07)
Basophils Absolute: 0 10*3/uL (ref 0.0–0.1)
Basophils Relative: 0 %
Eosinophils Absolute: 0.1 10*3/uL (ref 0.0–0.5)
Eosinophils Relative: 1 %
HCT: 41.5 % (ref 36.0–46.0)
Hemoglobin: 13.3 g/dL (ref 12.0–15.0)
Immature Granulocytes: 0 %
Lymphocytes Relative: 20 %
Lymphs Abs: 2.3 10*3/uL (ref 0.7–4.0)
MCH: 28.9 pg (ref 26.0–34.0)
MCHC: 32 g/dL (ref 30.0–36.0)
MCV: 90 fL (ref 80.0–100.0)
Monocytes Absolute: 0.5 10*3/uL (ref 0.1–1.0)
Monocytes Relative: 4 %
Neutro Abs: 8.5 10*3/uL — ABNORMAL HIGH (ref 1.7–7.7)
Neutrophils Relative %: 75 %
Platelets: 241 10*3/uL (ref 150–400)
RBC: 4.61 MIL/uL (ref 3.87–5.11)
RDW: 12.9 % (ref 11.5–15.5)
WBC: 11.3 10*3/uL — ABNORMAL HIGH (ref 4.0–10.5)
nRBC: 0 % (ref 0.0–0.2)

## 2019-06-30 LAB — I-STAT CHEM 8, ED
BUN: 5 mg/dL — ABNORMAL LOW (ref 6–20)
Calcium, Ion: 1.13 mmol/L — ABNORMAL LOW (ref 1.15–1.40)
Chloride: 104 mmol/L (ref 98–111)
Creatinine, Ser: 0.7 mg/dL (ref 0.44–1.00)
Glucose, Bld: 111 mg/dL — ABNORMAL HIGH (ref 70–99)
HCT: 41 % (ref 36.0–46.0)
Hemoglobin: 13.9 g/dL (ref 12.0–15.0)
Potassium: 3.4 mmol/L — ABNORMAL LOW (ref 3.5–5.1)
Sodium: 140 mmol/L (ref 135–145)
TCO2: 23 mmol/L (ref 22–32)

## 2019-06-30 LAB — I-STAT BETA HCG BLOOD, ED (MC, WL, AP ONLY): I-stat hCG, quantitative: <5 m[IU]/mL (ref <5)

## 2019-06-30 LAB — POC OCCULT BLOOD, ED: Fecal Occult Bld: POSITIVE — AB

## 2019-06-30 MED ORDER — IOHEXOL 300 MG/ML  SOLN
100.0000 mL | Freq: Once | INTRAMUSCULAR | Status: AC | PRN
  Administered 2019-06-30: 100 mL via INTRAVENOUS

## 2019-06-30 MED ORDER — SODIUM CHLORIDE (PF) 0.9 % IJ SOLN
INTRAMUSCULAR | Status: AC
  Filled 2019-06-30: qty 50

## 2019-06-30 NOTE — ED Provider Notes (Signed)
[facility]-EMERGENCY DEPT Provider Note   CSN: [id number] Arrival date & time: 06/30/19  0123     History   Chief Complaint Chief Complaint  Patient presents with   Rectal Bleeding    HPI Ashley Tate is a 28 y.o. female.     The history is provided by the patient.  Rectal Bleeding Quality:  Bright red Amount:  Scant Timing:  Rare Chronicity:  New Context: hemorrhoids   Context comment:  Had anal intercourse a few days ago Similar prior episodes: no   Relieved by:  Nothing Worsened by:  Nothing Ineffective treatments:  None tried Associated symptoms: no abdominal pain, no epistaxis, no fever, no hematemesis, no light-headedness, no recent illness and no vomiting   Risk factors: no anticoagulant use, no hx of colorectal cancer and no hx of IBD     Past Medical History:  Diagnosis Date   Asthma    Pap smear abnormality of vagina with ASC-US     Patient Active Problem List   Diagnosis Date Noted   Normal postpartum course 11/04/2018   Breech presentation delivered 11/03/2018    Past Surgical History:  Procedure Laterality Date   NO PAST SURGERIES       OB History    Gravida  3   Para  2   Term  2   Preterm  0   AB  1   Living  2     SAB  1   TAB  0   Ectopic  0   Multiple  0   Live Births  2            Home Medications    Prior to Admission medications   Medication Sig Start Date End Date Taking? Authorizing Provider  albuterol (PROVENTIL HFA;VENTOLIN HFA) 108 (90 Base) MCG/ACT inhaler Inhale 2 puffs into the lungs every 6 (six) hours as needed for wheezing or shortness of breath. 07/27/17  Yes Azalia Bilisampos, Kevin, MD  Chorionic Gonadotropin (HCG IJ) Inject 1 each as directed every 7 (seven) days.   Yes [provider]  Multiple Vitamin (MULTIVITAMIN WITH MINERALS) TABS tablet Take 1 tablet by mouth daily.   Yes [provider]  NON FORMULARY Inject 1 each into the skin every 30 (thirty)  days. Lipotropic injection.   Yes [provider]  Omega-3 Fatty Acids (FISH OIL PO) Take 1 capsule by mouth daily.   Yes [provider]  phentermine (ADIPEX-P) 37.5 MG tablet Take 37.5 mg by mouth every other day.   Yes [provider]  ibuprofen (ADVIL,MOTRIN) 600 MG tablet Take 1 tablet (600 mg total) by mouth every 6 (six) hours. Patient not taking: Reported on 06/30/2019 11/04/18   Dale DurhamMontana, Jade, FNP    Family History History reviewed. No pertinent family history.  Social History Social History   Tobacco Use   Smoking status: Former Smoker    Types: Cigarettes   Smokeless tobacco: Never Used  Substance Use Topics   Alcohol use: Not Currently    Comment: weekly   Drug use: No     Allergies   Aspirin   Review of Systems Review of Systems  Constitutional: Negative for fever.  HENT: Negative for nosebleeds.   Eyes: Negative for visual disturbance.  Respiratory: Negative for shortness of breath.   Cardiovascular: Negative for chest pain.  Gastrointestinal: Positive for anal bleeding and hematochezia. Negative for abdominal pain, blood in stool, constipation, diarrhea, hematemesis, nausea, rectal pain and vomiting.  Genitourinary: Negative  for flank pain, hematuria, menstrual problem, vaginal bleeding and vaginal pain.  Neurological: Negative for light-headedness.  All other systems reviewed and are negative.    Physical Exam Updated Vital Signs BP 115/76 (BP Location: Right Arm)    Pulse 95    Temp 98.8 F (37.1 C) (Oral)    Resp 16    Ht 5\' 3"  (1.6 m)    Wt 105.7 kg    SpO2 97%    BMI 41.27 kg/m   Physical Exam Vitals signs and nursing note reviewed.  Constitutional:      General: She is not in acute distress. HENT:     Head: Normocephalic and atraumatic.     Nose: Nose normal.  Eyes:     Conjunctiva/sclera: Conjunctivae normal.     Pupils: Pupils are equal, round, and reactive to light.  Neck:     Musculoskeletal: Normal range  of motion and neck supple.  Cardiovascular:     Rate and Rhythm: Normal rate and regular rhythm.     Pulses: Normal pulses.     Heart sounds: Normal heart sounds.  Pulmonary:     Effort: Pulmonary effort is normal.     Breath sounds: Normal breath sounds.  Abdominal:     General: Abdomen is flat. Bowel sounds are normal.     Tenderness: There is no abdominal tenderness. There is no rebound.  Genitourinary:    Rectum: Guaiac result positive.     Comments: Hemorrhoids internal and external hemorrhoid Musculoskeletal: Normal range of motion.  Skin:    General: Skin is warm and dry.     Capillary Refill: Capillary refill takes less than 2 seconds.  Neurological:     General: No focal deficit present.     Mental Status: She is alert and oriented to person, place, and time.  Psychiatric:        Mood and Affect: Mood normal.        Behavior: Behavior normal.      ED Treatments / Results  Labs (all labs ordered are listed, but only abnormal results are displayed) Results for orders placed or performed during the hospital encounter of 06/30/19  CBC with Differential/Platelet  Result Value Ref Range   WBC 11.3 (H) 4.0 - 10.5 K/uL   RBC 4.61 3.87 - 5.11 MIL/uL   Hemoglobin 13.3 12.0 - 15.0 g/dL   HCT 41.5 36.0 - 46.0 %   MCV 90.0 80.0 - 100.0 fL   MCH 28.9 26.0 - 34.0 pg   MCHC 32.0 30.0 - 36.0 g/dL   RDW 12.9 11.5 - 15.5 %   Platelets 241 150 - 400 K/uL   nRBC 0.0 0.0 - 0.2 %   Neutrophils Relative % 75 %   Neutro Abs 8.5 (H) 1.7 - 7.7 K/uL   Lymphocytes Relative 20 %   Lymphs Abs 2.3 0.7 - 4.0 K/uL   Monocytes Relative 4 %   Monocytes Absolute 0.5 0.1 - 1.0 K/uL   Eosinophils Relative 1 %   Eosinophils Absolute 0.1 0.0 - 0.5 K/uL   Basophils Relative 0 %   Basophils Absolute 0.0 0.0 - 0.1 K/uL   Immature Granulocytes 0 %   Abs Immature Granulocytes 0.02 0.00 - 0.07 K/uL  I-stat chem 8, ED (not at [facility] or [facility])  Result Value Ref Range   Sodium 140 135 - 145 mmol/L    Potassium 3.4 (L) 3.5 - 5.1 mmol/L   Chloride 104 98 - 111 mmol/L   BUN 5 (L) 6 -  20 mg/dL   Creatinine, Ser 1.24 0.44 - 1.00 mg/dL   Glucose, Bld 580 (H) 70 - 99 mg/dL   Calcium, Ion 9.98 (L) 1.15 - 1.40 mmol/L   TCO2 23 22 - 32 mmol/L   Hemoglobin 13.9 12.0 - 15.0 g/dL   HCT 33.8 25.0 - 53.9 %  POC occult blood, ED Provider will collect  Result Value Ref Range   Fecal Occult Bld POSITIVE (A) NEGATIVE  I-Stat Beta hCG blood, ED ([facility], [facility], [facility] only)  Result Value Ref Range   I-stat hCG, quantitative <5.0 <5 mIU/mL   Comment 3           Ct Abdomen Pelvis W Contrast  Result Date: 06/30/2019 CLINICAL DATA:  Lower GI bleeding EXAM: CT ABDOMEN AND PELVIS WITH CONTRAST TECHNIQUE: Multidetector CT imaging of the abdomen and pelvis was performed using the standard protocol following bolus administration of intravenous contrast. CONTRAST:  OMNIPAQUE IOHEXOL 300 MG/ML  SOLN COMPARISON:  None. FINDINGS: Lower chest:  No contributory findings. Hepatobiliary: No focal liver abnormality.No evidence of biliary obstruction or stone. Pancreas: Unremarkable. Spleen: Unremarkable. Adrenals/Urinary Tract: Negative adrenals. No hydronephrosis or stone. Unremarkable bladder. Stomach/Bowel: No explanation for GI bleeding. No evidence of colitis, vascular lesion, or diverticulosis. No visible hemorrhoids. No appendicitis. Vascular/Lymphatic: No acute vascular abnormality. No mass or adenopathy. Reproductive:Mild positioned IUD which is rotated in the uterus with left-sided arm pointing inferiorly and penetrating the myometrium. No serosal perforation. Other: No ascites or pneumoperitoneum. Musculoskeletal: No acute abnormalities. IMPRESSION: 1. No acute finding.  No explanation for GI bleeding. 2. Malpositioned IUD. Electronically Signed   By: Marnee Spring M.D.   On: 06/30/2019 04:33    Radiology Ct Abdomen Pelvis W Contrast  Result Date: 06/30/2019 CLINICAL DATA:  Lower GI bleeding EXAM: CT ABDOMEN AND  PELVIS WITH CONTRAST TECHNIQUE: Multidetector CT imaging of the abdomen and pelvis was performed using the standard protocol following bolus administration of intravenous contrast. CONTRAST:  OMNIPAQUE IOHEXOL 300 MG/ML  SOLN COMPARISON:  None. FINDINGS: Lower chest:  No contributory findings. Hepatobiliary: No focal liver abnormality.No evidence of biliary obstruction or stone. Pancreas: Unremarkable. Spleen: Unremarkable. Adrenals/Urinary Tract: Negative adrenals. No hydronephrosis or stone. Unremarkable bladder. Stomach/Bowel: No explanation for GI bleeding. No evidence of colitis, vascular lesion, or diverticulosis. No visible hemorrhoids. No appendicitis. Vascular/Lymphatic: No acute vascular abnormality. No mass or adenopathy. Reproductive:Mild positioned IUD which is rotated in the uterus with left-sided arm pointing inferiorly and penetrating the myometrium. No serosal perforation. Other: No ascites or pneumoperitoneum. Musculoskeletal: No acute abnormalities. IMPRESSION: 1. No acute finding.  No explanation for GI bleeding. 2. Malpositioned IUD. Electronically Signed   By: Marnee Spring M.D.   On: 06/30/2019 04:33    Procedures Procedures (including critical care time)  Medications Ordered in ED Medications  sodium chloride (PF) 0.9 % injection (has no administration in time range)  iohexol (OMNIPAQUE) 300 MG/ML solution 100 mL (100 mLs Intravenous Contrast Given 06/30/19 0404)     Patient has hemorrhoids and has had recent anal penetration and I believe this is likely the cause of her symptoms.  Have counseled on high fiber diet and stool softeners and advised against anal penetration.  There are no signs of IBD on CT scan and the patient has no pain.  Hemoglobin is very normal for a menstruating female.  Patient is stable for discharge with close follow up.  If symptoms recur patient will need to be seen by GI.   Ashley Tate was evaluated in  Emergency Department on 06/30/2019 for the  symptoms described in the history of present illness. She was evaluated in the context of the global COVID-19 pandemic, which necessitated consideration that the patient might be at risk for infection with the SARS-CoV-2 virus that causes COVID-19. Institutional protocols and algorithms that pertain to the evaluation of patients at risk for COVID-19 are in a state of rapid change based on information released by regulatory bodies including the CDC and federal and state organizations. These policies and algorithms were followed during the patient's care in the ED. *  Final Clinical Impressions(s) / ED Diagnoses   Return for intractable cough, coughing up blood,fevers >100.4 unrelieved by medication, shortness of breath, intractable vomiting, chest pain, shortness of breath, weakness,numbness, changes in speech, facial asymmetry,abdominal pain, passing out,Inability to tolerate liquids or food, cough, altered mental status or any concerns. No signs of systemic illness or infection. The patient is nontoxic-appearing on exam and vital signs are within normal limits.   I have reviewed the triage vital signs and the nursing notes. Pertinent labs &imaging results that were available during my care of the patient were reviewed by me and considered in my medical decision making (see chart for details).After history, exam, and medical workup I feel the patient has beenappropriately medically screened and is safe for discharge home. Pertinent diagnoses were discussed with the patient. Patient was given return precautions.    Ebon Ketchum, MD 06/30/19 [phone]

## 2019-06-30 NOTE — ED Notes (Signed)
Patient transported to CT 

## 2019-06-30 NOTE — ED Triage Notes (Signed)
Pt states that she used the bathroom and had a large  Amount of bright red blood in the toilet with clots, she states she felt lightheaded afterwards and sweaty

## 2019-09-23 ENCOUNTER — Emergency Department (HOSPITAL_COMMUNITY)
Admission: EM | Admit: 2019-09-23 | Discharge: 2019-09-23 | Disposition: A | Discharge status: Home / Self Care | Payer: BC Managed Care – PPO | Attending: Emergency Medicine | Admitting: Emergency Medicine

## 2019-09-23 ENCOUNTER — Encounter (HOSPITAL_COMMUNITY): Payer: Self-pay | Admitting: *Deleted

## 2019-09-23 ENCOUNTER — Other Ambulatory Visit: Payer: Self-pay

## 2019-09-23 DIAGNOSIS — R509 Fever, unspecified: Secondary | ICD-10-CM | POA: Insufficient documentation

## 2019-09-23 DIAGNOSIS — Z5321 Procedure and treatment not carried out due to patient leaving prior to being seen by health care provider: Secondary | ICD-10-CM | POA: Insufficient documentation

## 2019-09-23 DIAGNOSIS — M542 Cervicalgia: Secondary | ICD-10-CM | POA: Insufficient documentation

## 2019-09-23 NOTE — ED Notes (Signed)
Pt told registration she was leaving.  

## 2019-09-23 NOTE — ED Triage Notes (Signed)
Pt has abscess on left jaw, c/o cough x 3 weeks, now productive, fever x 2 nights, left side of neck sore

## 2019-10-10 ENCOUNTER — Encounter (HOSPITAL_BASED_OUTPATIENT_CLINIC_OR_DEPARTMENT_OTHER): Payer: Self-pay | Admitting: *Deleted

## 2019-10-10 ENCOUNTER — Emergency Department (HOSPITAL_BASED_OUTPATIENT_CLINIC_OR_DEPARTMENT_OTHER)
Admission: EM | Admit: 2019-10-10 | Discharge: 2019-10-11 | Disposition: A | Discharge status: Home / Self Care | Payer: BC Managed Care – PPO | Attending: Emergency Medicine | Admitting: Emergency Medicine

## 2019-10-10 ENCOUNTER — Emergency Department (HOSPITAL_BASED_OUTPATIENT_CLINIC_OR_DEPARTMENT_OTHER): Payer: BC Managed Care – PPO

## 2019-10-10 ENCOUNTER — Other Ambulatory Visit: Payer: Self-pay

## 2019-10-10 DIAGNOSIS — R5383 Other fatigue: Secondary | ICD-10-CM | POA: Diagnosis not present

## 2019-10-10 DIAGNOSIS — Z79899 Other long term (current) drug therapy: Secondary | ICD-10-CM | POA: Insufficient documentation

## 2019-10-10 DIAGNOSIS — R059 Cough, unspecified: Secondary | ICD-10-CM

## 2019-10-10 DIAGNOSIS — Z20828 Contact with and (suspected) exposure to other viral communicable diseases: Secondary | ICD-10-CM | POA: Diagnosis not present

## 2019-10-10 DIAGNOSIS — F17210 Nicotine dependence, cigarettes, uncomplicated: Secondary | ICD-10-CM | POA: Insufficient documentation

## 2019-10-10 DIAGNOSIS — R05 Cough: Secondary | ICD-10-CM | POA: Diagnosis not present

## 2019-10-10 DIAGNOSIS — R5381 Other malaise: Secondary | ICD-10-CM

## 2019-10-10 LAB — CBC WITH DIFFERENTIAL/PLATELET
Abs Immature Granulocytes: 0.03 10*3/uL (ref 0.00–0.07)
Basophils Absolute: 0.1 10*3/uL (ref 0.0–0.1)
Basophils Relative: 1 %
Eosinophils Absolute: 0.4 10*3/uL (ref 0.0–0.5)
Eosinophils Relative: 4 %
HCT: 42.7 % (ref 36.0–46.0)
Hemoglobin: 13.6 g/dL (ref 12.0–15.0)
Immature Granulocytes: 0 %
Lymphocytes Relative: 24 %
Lymphs Abs: 2.4 10*3/uL (ref 0.7–4.0)
MCH: 29.5 pg (ref 26.0–34.0)
MCHC: 31.9 g/dL (ref 30.0–36.0)
MCV: 92.6 fL (ref 80.0–100.0)
Monocytes Absolute: 0.5 10*3/uL (ref 0.1–1.0)
Monocytes Relative: 5 %
Neutro Abs: 6.6 10*3/uL (ref 1.7–7.7)
Neutrophils Relative %: 66 %
Platelets: 208 10*3/uL (ref 150–400)
RBC: 4.61 MIL/uL (ref 3.87–5.11)
RDW: 13.2 % (ref 11.5–15.5)
WBC: 9.9 10*3/uL (ref 4.0–10.5)
nRBC: 0 % (ref 0.0–0.2)

## 2019-10-10 LAB — SARS CORONAVIRUS 2 AG (30 MIN TAT): SARS Coronavirus 2 Ag: NEGATIVE

## 2019-10-10 LAB — URINALYSIS, ROUTINE W REFLEX MICROSCOPIC
Bilirubin Urine: NEGATIVE
Glucose, UA: NEGATIVE mg/dL
Hgb urine dipstick: NEGATIVE
Ketones, ur: 15 mg/dL — AB
Leukocytes,Ua: NEGATIVE
Nitrite: NEGATIVE
Protein, ur: NEGATIVE mg/dL
Specific Gravity, Urine: 1.01 (ref 1.005–1.030)
pH: 6 (ref 5.0–8.0)

## 2019-10-10 LAB — BASIC METABOLIC PANEL
Anion gap: 8 (ref 5–15)
BUN: 5 mg/dL — ABNORMAL LOW (ref 6–20)
CO2: 24 mmol/L (ref 22–32)
Calcium: 8.2 mg/dL — ABNORMAL LOW (ref 8.9–10.3)
Chloride: 104 mmol/L (ref 98–111)
Creatinine, Ser: 0.67 mg/dL (ref 0.44–1.00)
GFR calc Af Amer: >60 mL/min (ref >60)
GFR calc non Af Amer: >60 mL/min (ref >60)
Glucose, Bld: 103 mg/dL — ABNORMAL HIGH (ref 70–99)
Potassium: 3.7 mmol/L (ref 3.5–5.1)
Sodium: 136 mmol/L (ref 135–145)

## 2019-10-10 NOTE — Discharge Instructions (Signed)
You may take over-the-counter medicine for symptomatic relief, such as Tylenol, Motrin, TheraFlu, Alka seltzer , black elderberry, etc. Please limit acetaminophen (Tylenol) to 4000 mg and Ibuprofen (Motrin, Advil, etc.) to 2400 mg for a 24hr period. Please note that other over-the-counter medicine may contain acetaminophen or ibuprofen as a component of their ingredients.   

## 2019-10-10 NOTE — ED Triage Notes (Signed)
Chills, fever, body aches, headache, fatigue, cough, sob, sore throat, runny nose, diarrhea and vomiting x 2 months.

## 2019-10-10 NOTE — ED Provider Notes (Signed)
[facility] EMERGENCY DEPARTMENT Provider Note  CSN: [id number] Arrival date & time: 10/10/19 1931  Chief Complaint(s) Covid Symptoms  HPI Ashley Tate is a 28 y.o. female   The history is provided by the patient.  Illness Severity:  Moderate Onset quality:  Gradual Duration:  2 months Timing:  Constant Progression:  Improving Chronicity:  New Relieved by:  Mucinex, and other OTC meds Worsened by:  Nothing Associated symptoms: congestion, cough, fatigue and rhinorrhea   Associated symptoms: no vomiting     Past Medical History Past Medical History:  Diagnosis Date  . Asthma   . Pap smear abnormality of vagina with ASC-US    Patient Active Problem List   Diagnosis Date Noted  . Normal postpartum course 11/04/2018  . Breech presentation delivered 11/03/2018   Home Medication(s) Prior to Admission medications   Medication Sig Start Date End Date Taking? Authorizing Provider  albuterol (PROVENTIL HFA;VENTOLIN HFA) 108 (90 Base) MCG/ACT inhaler Inhale 2 puffs into the lungs every 6 (six) hours as needed for wheezing or shortness of breath. 07/27/17   Jola Schmidt, MD  Chorionic Gonadotropin (HCG IJ) Inject 1 each as directed every 7 (seven) days.    [provider]  ibuprofen (ADVIL,MOTRIN) 600 MG tablet Take 1 tablet (600 mg total) by mouth every 6 (six) hours. Patient not taking: Reported on 06/30/2019 11/04/18   Noralyn Pick, FNP  Multiple Vitamin (MULTIVITAMIN WITH MINERALS) TABS tablet Take 1 tablet by mouth daily.    [provider]  NON FORMULARY Inject 1 each into the skin every 30 (thirty) days. Lipotropic injection.    [provider]  Omega-3 Fatty Acids (FISH OIL PO) Take 1 capsule by mouth daily.    [provider]  phentermine (ADIPEX-P) 37.5 MG tablet Take 37.5 mg by mouth every other day.    [provider]                                    Past Surgical History Past Surgical History:  Procedure Laterality Date  . NO PAST SURGERIES     Family History No family history on file.  Social History Social History   Tobacco Use  . Smoking status: Current Some Day Smoker    Types: Cigarettes  . Smokeless tobacco: Never Used  Substance Use Topics  . Alcohol use: Not Currently    Comment: weekly  . Drug use: No   Allergies Aspirin  Review of Systems Review of Systems  Constitutional: Positive for fatigue.  HENT: Positive for congestion and rhinorrhea.   Respiratory: Positive for cough.   Gastrointestinal: Negative for vomiting.   As noted in HPI Physical Exam Vital Signs  I have reviewed the triage vital signs BP 140/90   Pulse 92   Temp 98.6 F (37 C) (Oral)   Resp 20   Ht 5\' 3"  (1.6 m)   Wt 100.7 kg   SpO2 100%   BMI 39.33 kg/m   Physical Exam Vitals reviewed.  Constitutional:      General: She is not in acute distress.    Appearance: She is well-developed. She is obese. She is not diaphoretic.  HENT:     Head: Normocephalic and atraumatic.     Nose: Nose normal.     Mouth/Throat:     Pharynx: Oropharynx is clear.  Eyes:     General: No scleral icterus.  Right eye: No discharge.        Left eye: No discharge.     Conjunctiva/sclera: Conjunctivae normal.     Pupils: Pupils are equal, round, and reactive to light.  Cardiovascular:     Rate and Rhythm: Normal rate and regular rhythm.     Heart sounds: No murmur. No friction rub. No gallop.   Pulmonary:     Effort: Pulmonary effort is normal. No respiratory distress.     Breath sounds: Normal breath sounds. No stridor. No wheezing, rhonchi or rales.  Abdominal:     General: There is no distension.     Palpations: Abdomen is soft.     Tenderness: There is abdominal tenderness.  Musculoskeletal:        General: No tenderness.     Cervical back: Normal range of motion and neck supple.  Skin:    General:  Skin is warm and dry.     Findings: No erythema or rash.  Neurological:     Mental Status: She is alert and oriented to person, place, and time.     ED Results and Treatments Labs (all labs ordered are listed, but only abnormal results are displayed) Labs Reviewed  BASIC METABOLIC PANEL - Abnormal; Notable for the following components:      Result Value   Glucose, Bld 103 (*)    BUN 5 (*)    Calcium 8.2 (*)    All other components within normal limits  URINALYSIS, ROUTINE W REFLEX MICROSCOPIC - Abnormal; Notable for the following components:   Ketones, ur 15 (*)    All other components within normal limits  SARS CORONAVIRUS 2 AG (30 MIN TAT)  CBC WITH DIFFERENTIAL/PLATELET                                                                                                                         EKG  EKG Interpretation  Date/Time:    Ventricular Rate:    PR Interval:    QRS Duration:   QT Interval:    QTC Calculation:   R Axis:     Text Interpretation:        Radiology DG Chest Portable 1 View  Result Date: 10/10/2019 CLINICAL DATA:  Shortness of breath EXAM: PORTABLE CHEST 1 VIEW COMPARISON:  07/27/2017 FINDINGS: The heart size and mediastinal contours are within normal limits. Both lungs are clear. The visualized skeletal structures are unremarkable. IMPRESSION: No active disease. Electronically Signed   By: Jasmine PangKim  Fujinaga M.D.   On: 10/10/2019 23:25    Pertinent labs & imaging results that were available during my care of the patient were reviewed by me and considered in my medical decision making (see chart for details).  Medications Ordered in ED Medications - No data to display  Procedures Procedures  (including critical care time)  Medical Decision Making / ED Course I have reviewed the nursing notes for this encounter and the patient's  prior records (if available in EHR or on provided paperwork).   Ashley Tate was evaluated in Emergency Department on 10/10/2019 for the symptoms described in the history of present illness. She was evaluated in the context of the global COVID-19 pandemic, which necessitated consideration that the patient might be at risk for infection with the SARS-CoV-2 virus that causes COVID-19. Institutional protocols and algorithms that pertain to the evaluation of patients at risk for COVID-19 are in a state of rapid change based on information released by regulatory bodies including the CDC and federal and state organizations. These policies and algorithms were followed during the patient's care in the ED.  Here for generalized fatigue, cough, and malaise for several months.  The patient appears well, in no acute distress, without evidence of toxicity or dehydration.  Labs w/o leukocytosis, electrolyte derangements, renal sufficiency.  No evidence of urinary tract infection.  No anemia.  Covid negative.  The patient appears reasonably screened and/or stabilized for discharge and I doubt any other medical condition or other [facility] requiring further screening, evaluation, or treatment in the ED at this time prior to discharge.  The patient is safe for discharge with strict return precautions.         Final Clinical Impression(s) / ED Diagnoses Final diagnoses:  Cough  Malaise    The patient appears reasonably screened and/or stabilized for discharge and I doubt any other medical condition or other [facility] requiring further screening, evaluation, or treatment in the ED at this time prior to discharge.  Disposition: Discharge  Condition: Good  I have discussed the results, Dx and Tx plan with the patient who expressed understanding and agree(s) with the plan. Discharge instructions discussed at great length. The patient was given strict return precautions who verbalized understanding of the instructions.  No further questions at time of discharge.    ED Discharge Orders    None       Follow Up: Primary care provider  Schedule an appointment as soon as possible for a visit        This chart was dictated using voice recognition software.  Despite best efforts to proofread,  errors can occur which can change the documentation meaning.   Nira Conn, MD 10/10/19 [phone]

## 2019-12-20 ENCOUNTER — Ambulatory Visit: Payer: BC Managed Care – PPO | Attending: Internal Medicine

## 2020-01-18 ENCOUNTER — Ambulatory Visit: Payer: Self-pay

## 2020-01-18 ENCOUNTER — Ambulatory Visit: Payer: Self-pay | Attending: Internal Medicine

## 2020-01-18 DIAGNOSIS — Z23 Encounter for immunization: Secondary | ICD-10-CM

## 2020-01-18 NOTE — Progress Notes (Signed)
   Covid-19 Vaccination Clinic  Name:  Ashley Tate    MRN: [id number] DOB: [birth year]  01/18/2020  [name] was observed post Covid-19 immunization for 30 minutes based on pre-vaccination screening without incident. She was provided with Vaccine Information Sheet and instruction to access the V-Safe system.   [name] was instructed to call 911 with any severe reactions post vaccine: Marland Kitchen Difficulty breathing  . Swelling of face and throat  . A fast heartbeat  . A bad rash all over body  . Dizziness and weakness   Immunizations Administered    Name Date Dose VIS Date Route   Pfizer COVID-19 Vaccine 01/18/2020 12:34 PM 0.3 mL 09/22/2019 Intramuscular   Manufacturer: [employer], [employer]   Lot: [id number]   NDC: [address]-5

## 2020-02-12 ENCOUNTER — Ambulatory Visit: Payer: Self-pay | Attending: Internal Medicine

## 2020-02-12 ENCOUNTER — Ambulatory Visit: Payer: Self-pay

## 2020-02-12 DIAGNOSIS — Z23 Encounter for immunization: Secondary | ICD-10-CM

## 2020-02-12 NOTE — Progress Notes (Signed)
   Covid-19 Vaccination Clinic  Name:  Evalynne Locurto    MRN: [id number] DOB: 05-13-1991  02/12/2020  Ms. Lardner was observed post Covid-19 immunization for 15 minutes without incident. She was provided with Vaccine Information Sheet and instruction to access the V-Safe system.   Ms. Fread was instructed to call 911 with any severe reactions post vaccine: Marland Kitchen Difficulty breathing  . Swelling of face and throat  . A fast heartbeat  . A bad rash all over body  . Dizziness and weakness   Immunizations Administered    Name Date Dose VIS Date Route   Pfizer COVID-19 Vaccine 02/12/2020 10:40 AM 0.3 mL 12/06/2018 Intramuscular   Manufacturer: [employer], [employer]   Lot: [id number]   NDC: [address]-8

## 2020-03-22 IMAGING — CT CT ABD-PELV W/ CM
2 of 4 series · 17 of 46 positions shown, 19 images · IV contrast (OMNIPAQUE 300)
Comparison: None.

CLINICAL DATA: Lower GI bleeding

EXAM:
CT ABDOMEN AND PELVIS WITH CONTRAST
TECHNIQUE: Multidetector CT imaging of the abdomen and pelvis was performed
using the standard protocol following bolus administration of
intravenous contrast.
CONTRAST:  100mL OMNIPAQUE IOHEXOL 300 MG/ML  SOLN

[Series 2: axial st · axial · 0.68mm/px · z∈[+1076,+1466]mm · 14 of 88 slices shown, 16 images]
[im 5/88  soft-tissue]
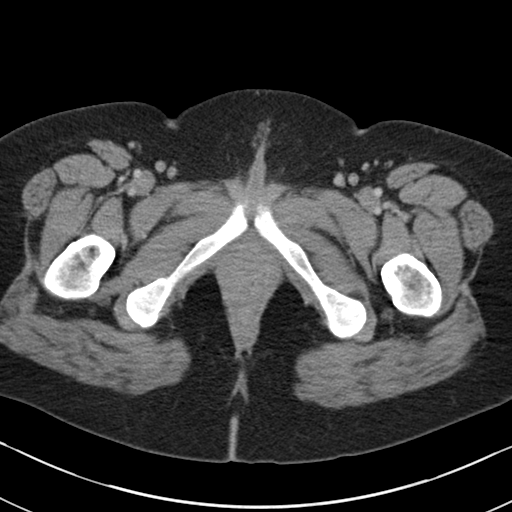
[im 5/88  bone]
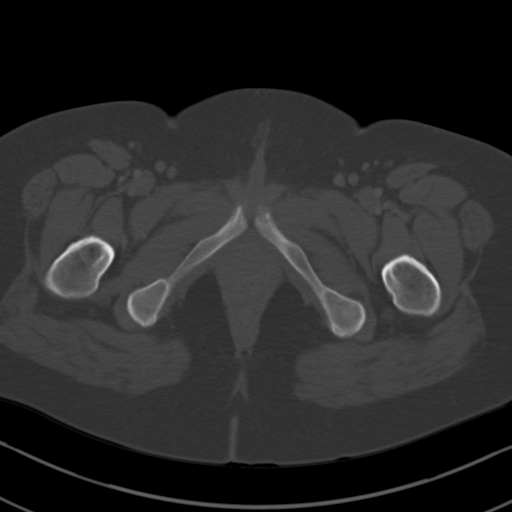
[im 10/88  soft-tissue]
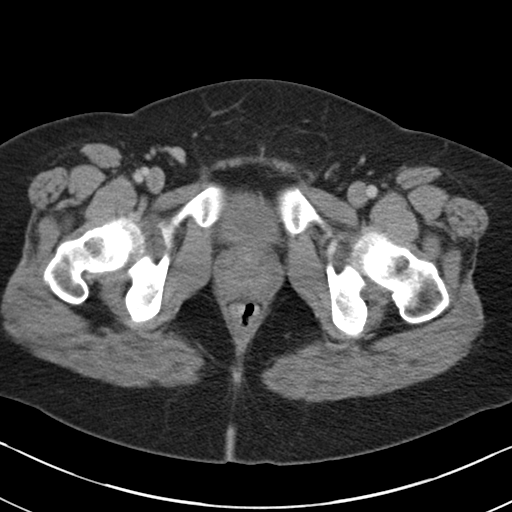
[im 20/88  soft-tissue]
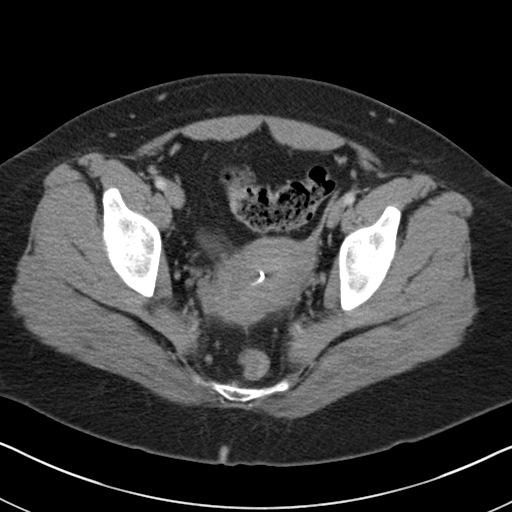
[im 25/88  soft-tissue]
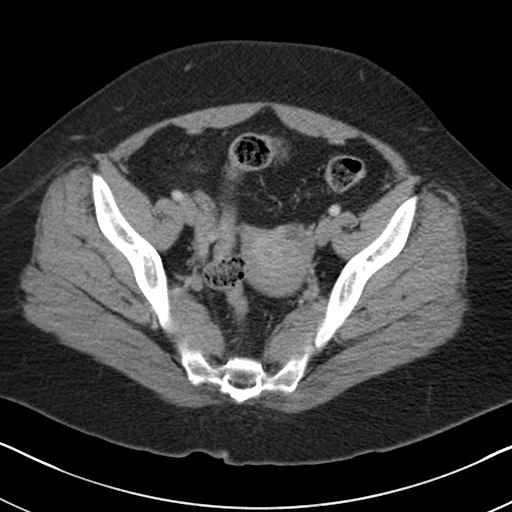
[im 30/88  soft-tissue]
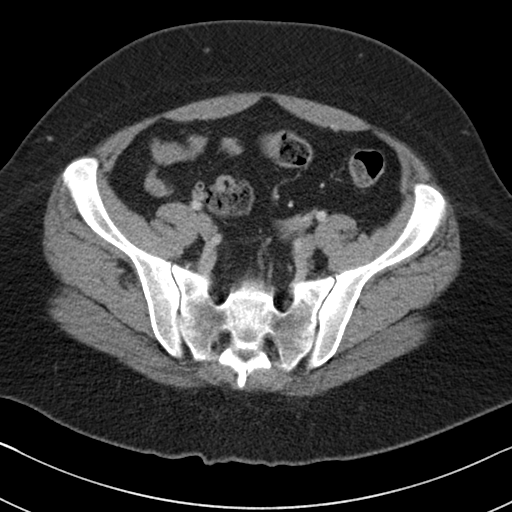
[im 34/88  soft-tissue]
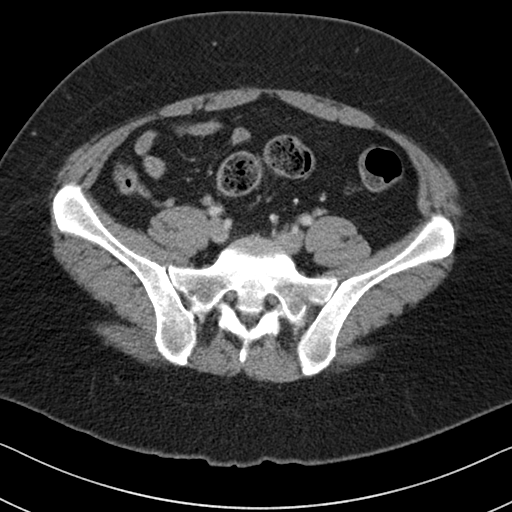
[im 39/88  soft-tissue]
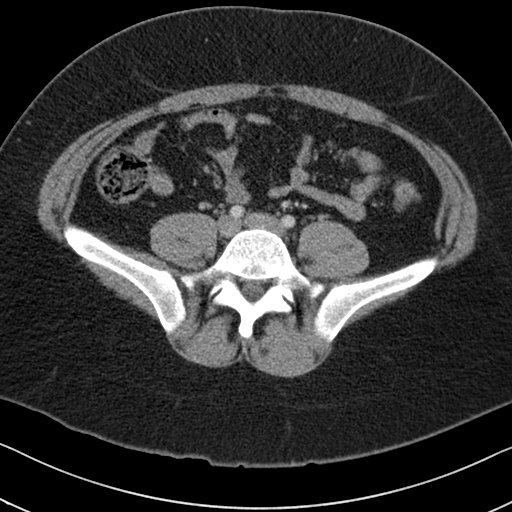
[im 49/88  soft-tissue]
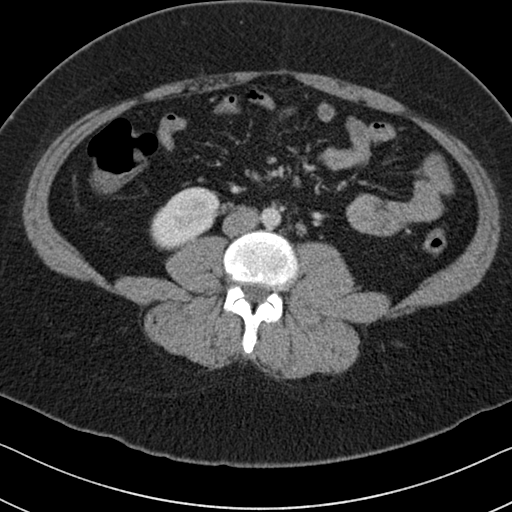
[im 54/88  soft-tissue]
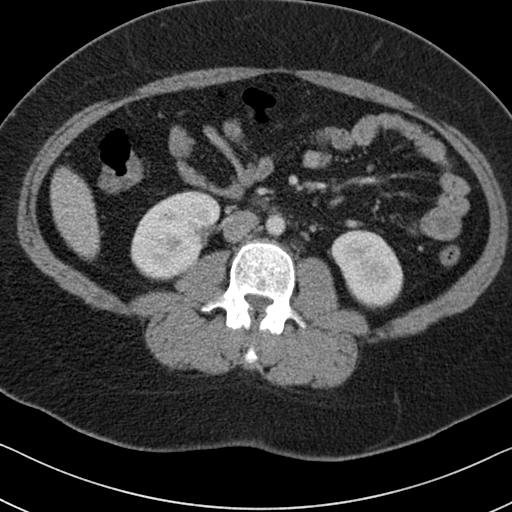
[im 54/88  bone]
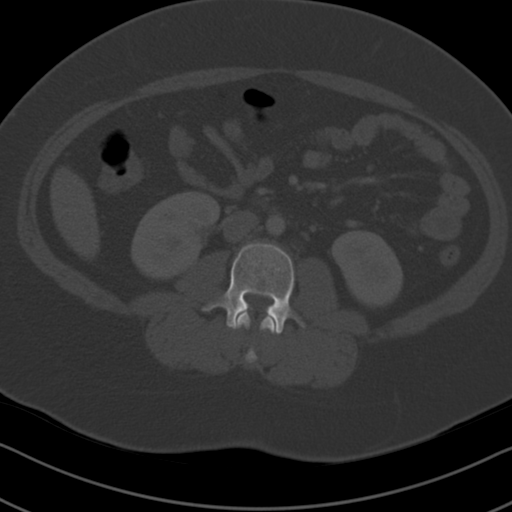
[im 59/88  soft-tissue]
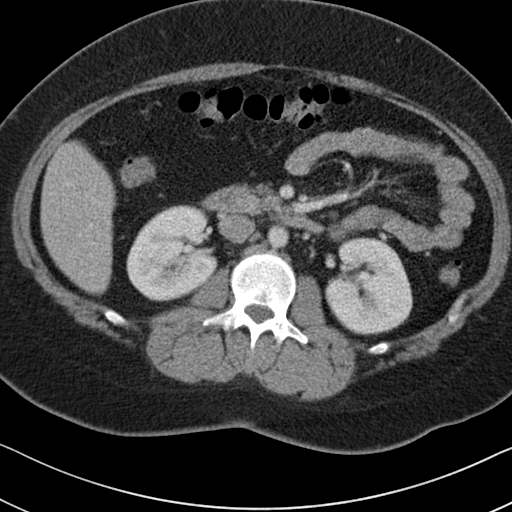
[im 63/88  soft-tissue]
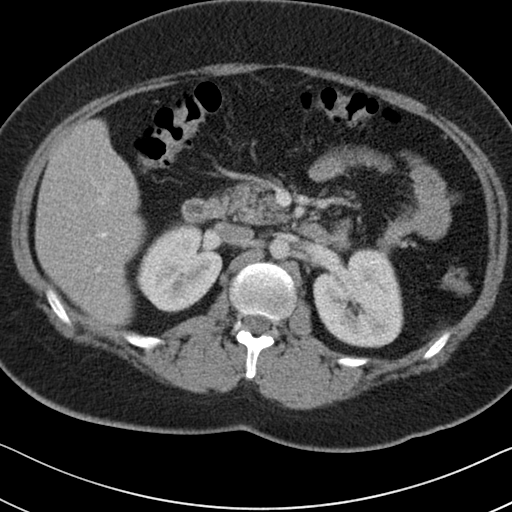
[im 68/88  soft-tissue]
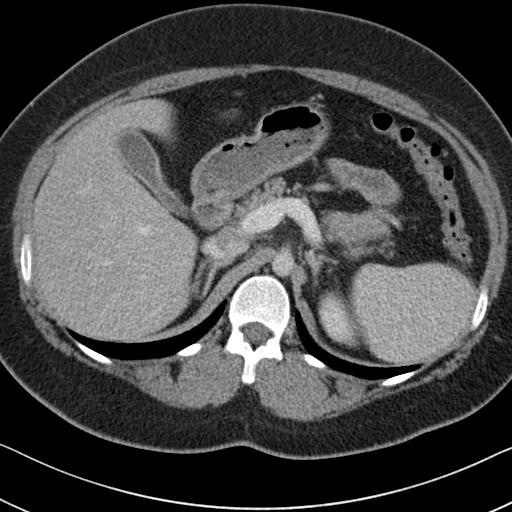
[im 78/88  soft-tissue]
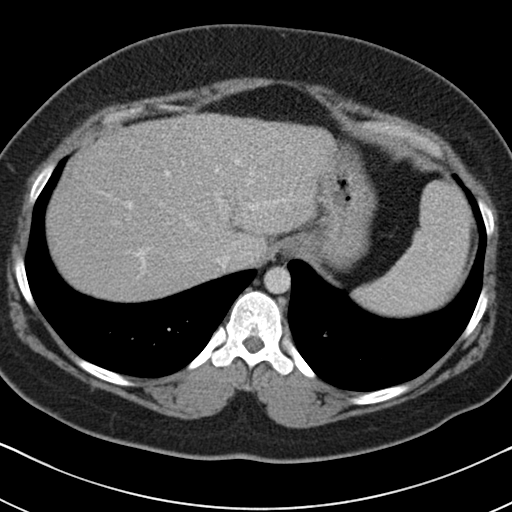
[im 83/88  soft-tissue]
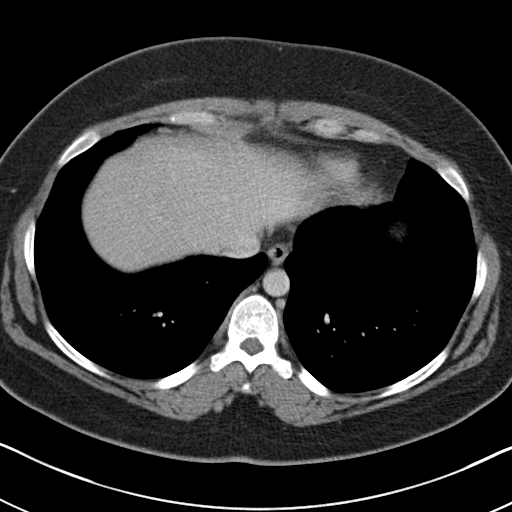

[Series 5: coronal st · coronal · 0.83mm/px · 3 of 151 slices shown]
[im 51/151  soft-tissue]
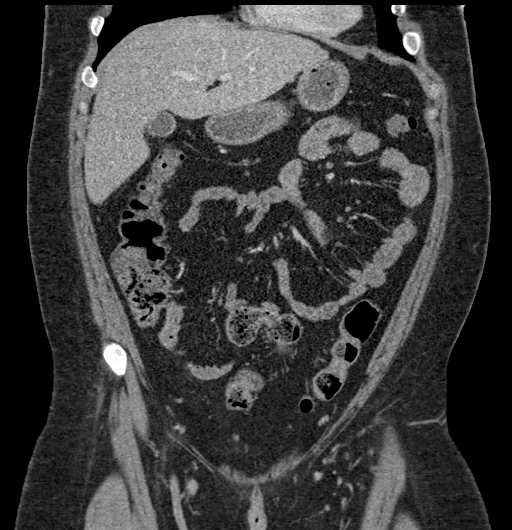
[im 67/151  soft-tissue]
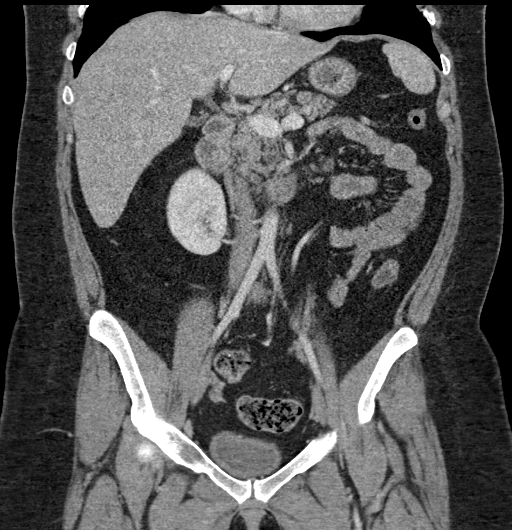
[im 84/151  soft-tissue]
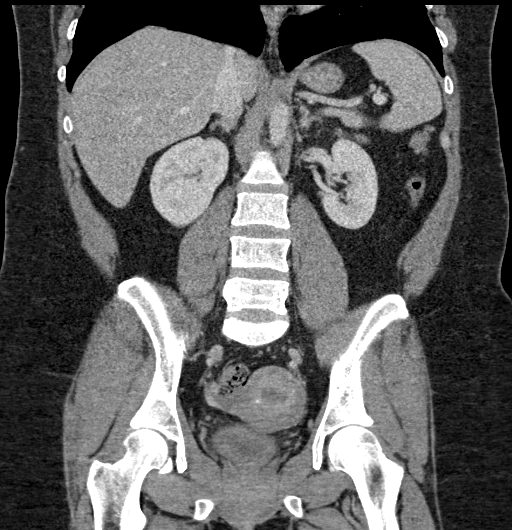

[17 of 46 positions shown; findings below may reference images not displayed]

FINDINGS: Lower chest:  No contributory findings.

Hepatobiliary: No focal liver abnormality.No evidence of biliary
obstruction or stone.

Pancreas: Unremarkable.

Spleen: Unremarkable.

Adrenals/Urinary Tract: Negative adrenals. No hydronephrosis or
stone. Unremarkable bladder.

Stomach/Bowel: No explanation for GI bleeding. No evidence of
colitis, vascular lesion, or diverticulosis. No visible hemorrhoids.
No appendicitis.

Vascular/Lymphatic: No acute vascular abnormality. No mass or
adenopathy.

Reproductive:Mild positioned IUD which is rotated in the uterus with
left-sided arm pointing inferiorly and penetrating the myometrium.
No serosal perforation.

Other: No ascites or pneumoperitoneum.

Musculoskeletal: No acute abnormalities.
IMPRESSION: 1. No acute finding.  No explanation for GI bleeding.
2. Malpositioned IUD.

## 2022-07-25 ENCOUNTER — Other Ambulatory Visit: Payer: Self-pay

## 2022-07-25 ENCOUNTER — Emergency Department (HOSPITAL_COMMUNITY)
Admission: EM | Admit: 2022-07-25 | Discharge: 2022-07-25 | Disposition: A | Discharge status: Home Health | Payer: Commercial Managed Care - HMO | Attending: Emergency Medicine | Admitting: Emergency Medicine

## 2022-07-25 ENCOUNTER — Encounter (HOSPITAL_COMMUNITY): Payer: Self-pay

## 2022-07-25 DIAGNOSIS — K047 Periapical abscess without sinus: Secondary | ICD-10-CM | POA: Diagnosis not present

## 2022-07-25 DIAGNOSIS — K0889 Other specified disorders of teeth and supporting structures: Secondary | ICD-10-CM | POA: Diagnosis present

## 2022-07-25 MED ORDER — CELECOXIB 200 MG PO CAPS: 200.0000 mg | ORAL_CAPSULE | Freq: Two times a day (BID) | ORAL | 0 refills | Status: DC

## 2022-07-25 MED ORDER — AMOXICILLIN-POT CLAVULANATE 875-125 MG PO TABS: 1.0000 | ORAL_TABLET | Freq: Two times a day (BID) | ORAL | 0 refills | Status: DC

## 2022-07-25 MED ORDER — TRAMADOL HCL 50 MG PO TABS: 50.0000 mg | ORAL_TABLET | Freq: Four times a day (QID) | ORAL | 0 refills | Status: DC | PRN

## 2022-07-25 NOTE — Discharge Instructions (Signed)

## 2022-07-25 NOTE — ED Triage Notes (Signed)
Pt has upper and lower dental pain. Pt states that she thinks she may have an abscess. Pt is to have a root canal next month.

## 2022-07-25 NOTE — ED Provider Notes (Signed)
[facility]-EMERGENCY DEPT Provider Note   CSN: [id number] Arrival date & time: 07/25/22  4008     History  Chief Complaint  Patient presents with   Dental Pain    Ashley Tate is a 31 y.o. female.  The history is provided by the patient and medical records.  Dental Pain Location:  Lower Lower teeth location:  32/RL 3rd molar Quality:  Aching, constant, pressure-like, throbbing and radiating Severity:  Severe Onset quality:  Gradual Duration:  3 days Timing:  Constant Progression:  Worsening Chronicity:  Recurrent Context: abscess and dental caries   Relieved by:  Nothing Worsened by:  Cold food/drink, hot food/drink, touching, jaw movement and pressure Ineffective treatments:  Acetaminophen, NSAIDs, topical anesthetic gel, rest, ice and heat Associated symptoms: facial swelling and trismus (mild)   Associated symptoms: no difficulty swallowing, no drooling, no facial pain, no fever, no gum swelling, no headaches, no neck pain, no neck swelling, no oral bleeding and no oral lesions   Risk factors: lack of dental care        Home Medications Prior to Admission medications   Medication Sig Start Date End Date Taking? Authorizing Provider  albuterol (PROVENTIL HFA;VENTOLIN HFA) 108 (90 Base) MCG/ACT inhaler Inhale 2 puffs into the lungs every 6 (six) hours as needed for wheezing or shortness of breath. 07/27/17   Azalia Bilis, MD  Chorionic Gonadotropin (HCG IJ) Inject 1 each as directed every 7 (seven) days.    [provider]  ibuprofen (ADVIL,MOTRIN) 600 MG tablet Take 1 tablet (600 mg total) by mouth every 6 (six) hours. Patient not taking: Reported on 06/30/2019 11/04/18   Dale [facility], FNP  Multiple Vitamin (MULTIVITAMIN WITH MINERALS) TABS tablet Take 1 tablet by mouth daily.    [provider]  NON FORMULARY Inject 1 each into the skin every 30 (thirty) days. Lipotropic injection.    [provider]  Omega-3 Fatty  Acids (FISH OIL PO) Take 1 capsule by mouth daily.    [provider]  phentermine (ADIPEX-P) 37.5 MG tablet Take 37.5 mg by mouth every other day.    [provider]      Allergies    Aspirin    Review of Systems   Review of Systems  Constitutional:  Negative for fever.  HENT:  Positive for dental problem and facial swelling. Negative for drooling, mouth sores, trouble swallowing and voice change.   Respiratory:  Negative for choking.   Musculoskeletal:  Negative for neck pain.  Neurological:  Negative for headaches.    Physical Exam Updated Vital Signs BP (!) 135/93   Pulse 74   Temp 98.2 F (36.8 C) (Oral)   Resp 15   Ht 5\' 3"  (1.6 m)   Wt 93 kg   SpO2 97%   BMI 36.31 kg/m  Physical Exam Constitutional:      Appearance: She is not ill-appearing.  HENT:     Head: Normocephalic and atraumatic.     Right Ear: Tympanic membrane normal.     Left Ear: Tympanic membrane normal.     Nose: Nose normal.     Mouth/Throat:     Mouth: Mucous membranes are moist.     Comments: RL wisdom tooth impacted and decayed below the gumline. Mild gingival eyrthema without palpable abscess. Minimal swelling over the mandible that does not spread below the angle of the mandible.  Eyes:     Pupils: Pupils are equal, round, and reactive to light.  Pulmonary:  Effort: Pulmonary effort is normal.  Abdominal:     General: There is no distension.  Skin:    General: Skin is warm and dry.  Neurological:     Mental Status: She is alert and oriented to person, place, and time.  Psychiatric:        Behavior: Behavior normal.     ED Results / Procedures / Treatments   Labs (all labs ordered are listed, but only abnormal results are displayed) Labs Reviewed - No data to display  EKG None  Radiology No results found.  Procedures Procedures    Medications Ordered in ED Medications - No data to display  ED Course/ Medical Decision Making/ A&P                            Medical Decision Making Patient with toothache.  No abscess amenable to I&D.  Exam unconcerning for Ludwig's angina or spread of infection.  Will treat with abx and pain medicine.  Urged patient to follow-up with dentist.     Risk Prescription drug management. Risk Details: PDMP reviewed during this encounter.           Final Clinical Impression(s) / ED Diagnoses Final diagnoses:  None    Rx / DC Orders ED Discharge Orders     None         Margarita Mail, PA-C 07/25/22 2229    Ezequiel Essex, MD 07/25/22 1413

## 2023-04-20 ENCOUNTER — Emergency Department (HOSPITAL_COMMUNITY): Payer: Commercial Managed Care - HMO

## 2023-04-20 ENCOUNTER — Emergency Department (HOSPITAL_COMMUNITY)
Admission: EM | Admit: 2023-04-20 | Discharge: 2023-04-20 | Disposition: A | Discharge status: Home / Self Care | Payer: Commercial Managed Care - HMO | Attending: Emergency Medicine | Admitting: Emergency Medicine

## 2023-04-20 ENCOUNTER — Encounter (HOSPITAL_COMMUNITY): Payer: Self-pay

## 2023-04-20 DIAGNOSIS — R1030 Lower abdominal pain, unspecified: Secondary | ICD-10-CM | POA: Diagnosis not present

## 2023-04-20 DIAGNOSIS — O99111 Other diseases of the blood and blood-forming organs and certain disorders involving the immune mechanism complicating pregnancy, first trimester: Secondary | ICD-10-CM | POA: Insufficient documentation

## 2023-04-20 DIAGNOSIS — Z3A01 Less than 8 weeks gestation of pregnancy: Secondary | ICD-10-CM | POA: Insufficient documentation

## 2023-04-20 DIAGNOSIS — O26891 Other specified pregnancy related conditions, first trimester: Secondary | ICD-10-CM | POA: Diagnosis not present

## 2023-04-20 DIAGNOSIS — D72829 Elevated white blood cell count, unspecified: Secondary | ICD-10-CM | POA: Diagnosis not present

## 2023-04-20 LAB — CBC WITH DIFFERENTIAL/PLATELET
Abs Immature Granulocytes: 0.03 10*3/uL (ref 0.00–0.07)
Basophils Absolute: 0 10*3/uL (ref 0.0–0.1)
Basophils Relative: 0 %
Eosinophils Absolute: 0.1 10*3/uL (ref 0.0–0.5)
Eosinophils Relative: 1 %
HCT: 41.1 % (ref 36.0–46.0)
Hemoglobin: 13.1 g/dL (ref 12.0–15.0)
Immature Granulocytes: 0 %
Lymphocytes Relative: 24 %
Lymphs Abs: 2.9 10*3/uL (ref 0.7–4.0)
MCH: 29.1 pg (ref 26.0–34.0)
MCHC: 31.9 g/dL (ref 30.0–36.0)
MCV: 91.3 fL (ref 80.0–100.0)
Monocytes Absolute: 0.5 10*3/uL (ref 0.1–1.0)
Monocytes Relative: 4 %
Neutro Abs: 8.7 10*3/uL — ABNORMAL HIGH (ref 1.7–7.7)
Neutrophils Relative %: 71 %
Platelets: 187 10*3/uL (ref 150–400)
RBC: 4.5 MIL/uL (ref 3.87–5.11)
RDW: 13.2 % (ref 11.5–15.5)
WBC: 12.2 10*3/uL — ABNORMAL HIGH (ref 4.0–10.5)
nRBC: 0 % (ref 0.0–0.2)

## 2023-04-20 LAB — COMPREHENSIVE METABOLIC PANEL
ALT: 18 U/L (ref 0–44)
AST: 21 U/L (ref 15–41)
Albumin: 3.8 g/dL (ref 3.5–5.0)
Alkaline Phosphatase: 61 U/L (ref 38–126)
Anion gap: 8 (ref 5–15)
BUN: <5 mg/dL — ABNORMAL LOW (ref 6–20)
CO2: 21 mmol/L — ABNORMAL LOW (ref 22–32)
Calcium: 8.5 mg/dL — ABNORMAL LOW (ref 8.9–10.3)
Chloride: 107 mmol/L (ref 98–111)
Creatinine, Ser: 0.6 mg/dL (ref 0.44–1.00)
GFR, Estimated: >60 mL/min (ref >60)
Glucose, Bld: 86 mg/dL (ref 70–99)
Potassium: 3.8 mmol/L (ref 3.5–5.1)
Sodium: 136 mmol/L (ref 135–145)
Total Bilirubin: 0.4 mg/dL (ref 0.3–1.2)
Total Protein: 7.4 g/dL (ref 6.5–8.1)

## 2023-04-20 LAB — PREGNANCY, URINE: Preg Test, Ur: POSITIVE — AB

## 2023-04-20 LAB — HCG, QUANTITATIVE, PREGNANCY: hCG, Beta Chain, Quant, S: 52830 m[IU]/mL — ABNORMAL HIGH (ref <5)

## 2023-04-20 NOTE — ED Triage Notes (Signed)
Pt arrived via POV, states she currently has an IUD in place, had x3 positive pregnancy tests this morning. Concerned it was out of place.

## 2023-04-20 NOTE — ED Provider Notes (Signed)
[place] EMERGENCY DEPARTMENT AT [facility] Provider Note   CSN: [id number] Arrival date & time: 04/20/23  1232     History  Chief Complaint  Patient presents with   Possible Pregnancy    Ashley Tate is a 32 y.o. female.  32 y.o female G3P2 currently pregnancy currently unknown length of her pregnancy presents to the ED with a chief complaint of some lower abdominal cramping, 3 positive pregnancy test at home.  Patient reports she has had an IUD in place for several years, currently has a 34 year old, and a 66-year-old.  She did have 3 weeks ago some heavy.,  Reports she does not get a menstrual cycle with her IUD however did have some bleeding.  She began spotting 2 days ago.  She did have nausea with this IUD, however nausea has resolved now.  She reports having some cramping.  Called her OB/[facility]N at [facility] who referred her to the emergency department for further evaluation of her symptoms.  No fever, no chills, no other complaints. LMP unknown as she reports last one 4 years ago.   The history is provided by the patient.  Possible Pregnancy Pertinent negatives include no chest pain, no abdominal pain and no shortness of breath.       Home Medications Prior to Admission medications   Medication Sig Start Date End Date Taking? Authorizing Provider  albuterol (PROVENTIL HFA;VENTOLIN HFA) 108 (90 Base) MCG/ACT inhaler Inhale 2 puffs into the lungs every 6 (six) hours as needed for wheezing or shortness of breath. 07/27/17   Azalia Bilis, MD  amoxicillin-clavulanate (AUGMENTIN) 875-125 MG tablet Take 1 tablet by mouth every 12 (twelve) hours. 07/25/22   Arthor Captain, PA-C  celecoxib (CELEBREX) 200 MG capsule Take 1 capsule (200 mg total) by mouth 2 (two) times daily. 07/25/22   Harris, Cammy Copa, PA-C  Chorionic Gonadotropin (HCG IJ) Inject 1 each as directed every 7 (seven) days.    [provider]  ibuprofen (ADVIL,MOTRIN) 600 MG tablet Take 1 tablet  (600 mg total) by mouth every 6 (six) hours. Patient not taking: Reported on 06/30/2019 11/04/18   Dale [facility], FNP  Multiple Vitamin (MULTIVITAMIN WITH MINERALS) TABS tablet Take 1 tablet by mouth daily.    [provider]  NON FORMULARY Inject 1 each into the skin every 30 (thirty) days. Lipotropic injection.    [provider]  Omega-3 Fatty Acids (FISH OIL PO) Take 1 capsule by mouth daily.    [provider]  phentermine (ADIPEX-P) 37.5 MG tablet Take 37.5 mg by mouth every other day.    [provider]  traMADol (ULTRAM) 50 MG tablet Take 1 tablet (50 mg total) by mouth every 6 (six) hours as needed. 07/25/22   Arthor Captain, PA-C      Allergies    Aspirin    Review of Systems   Review of Systems  Constitutional:  Negative for chills and fever.  Respiratory:  Negative for shortness of breath.   Cardiovascular:  Negative for chest pain.  Gastrointestinal:  Positive for nausea. Negative for abdominal pain and vomiting.  Genitourinary:  Negative for flank pain.  Musculoskeletal:  Negative for back pain.  All other systems reviewed and are negative.   Physical Exam Updated Vital Signs BP 128/78 (BP Location: Left Arm)   Pulse 98   Temp 98.7 F (37.1 C) (Oral)   Resp 16   Ht 5\' 3"  (1.6 m)   Wt 94.4 kg   SpO2 99%  BMI 36.88 kg/m  Physical Exam Vitals and nursing note reviewed.  Constitutional:      Appearance: Normal appearance.  HENT:     Head: Normocephalic and atraumatic.     Mouth/Throat:     Mouth: Mucous membranes are moist.  Cardiovascular:     Rate and Rhythm: Normal rate.  Pulmonary:     Effort: Pulmonary effort is normal.  Abdominal:     General: Abdomen is flat.  Musculoskeletal:     Cervical back: Normal range of motion and neck supple.  Skin:    General: Skin is warm and dry.  Neurological:     Mental Status: She is alert and oriented to person, place, and time.     ED Results / Procedures / Treatments    Labs (all labs ordered are listed, but only abnormal results are displayed) Labs Reviewed  PREGNANCY, URINE - Abnormal; Notable for the following components:      Result Value   Preg Test, Ur POSITIVE (*)    All other components within normal limits  CBC WITH DIFFERENTIAL/PLATELET - Abnormal; Notable for the following components:   WBC 12.2 (*)    Neutro Abs 8.7 (*)    All other components within normal limits  COMPREHENSIVE METABOLIC PANEL - Abnormal; Notable for the following components:   CO2 21 (*)    BUN <5 (*)    Calcium 8.5 (*)    All other components within normal limits  HCG, QUANTITATIVE, PREGNANCY - Abnormal; Notable for the following components:   hCG, Beta Chain, Quant, S 52,830 (*)    All other components within normal limits    EKG None  Radiology US OB LESS THAN 14 WEEKS WITH OB TRANSVAGINAL  Result Date: 04/20/2023 CLINICAL DATA:  Pregnancy. EXAM: OBSTETRIC <14 WK [place] AND TRANSVAGINAL OB US TECHNIQUE: Both transabdominal and transvaginal ultrasound examinations were performed for complete evaluation of the gestation as well as the maternal uterus, adnexal regions, and pelvic cul-de-sac. Transvaginal technique was performed to assess early pregnancy. COMPARISON:  None Available. FINDINGS: Intrauterine gestational sac: Single Yolk sac:  Visualized. Embryo:  Visualized. Cardiac Activity: Visualized. Heart Rate: 157 bpm CRL:  1.3 mm   7 w   4 d Subchorionic hemorrhage:  None visualized. Maternal uterus/adnexae: The right ovary measures 1.8 cm x 2.8 cm x 1.7 cm (volume 4.43 mL) and is normal in appearance. The left ovary is not visualized. No pelvic free fluid is seen. IMPRESSION: Single, viable intrauterine pregnancy at approximately 7 weeks and 4 days gestation by ultrasound evaluation. Electronically Signed   By: Aram Candela M.D.   On: 04/20/2023 19:10    Procedures Procedures    Medications Ordered in ED Medications - No data to display  ED Course/ Medical  Decision Making/ A&P Clinical Course as of 04/20/23 1953  Tue Apr 20, 2023  1501 Preg Test, Ur(!): POSITIVE [JS]  1716 HCG, Beta Chain, Mahalia Longest(!): 08,657 [JS]    Clinical Course User Index [JS] Claude Manges, PA-C                             Medical Decision Making Amount and/or Complexity of Data Reviewed Labs: ordered. Decision-making details documented in ED Course.   Patient presents to the ED with a chief complaint of lower abdominal cramping, positive pregnancy test that she had at home.  Concern for pregnancy and told by her [facility] to be evaluated in the emergency department.  Here she is without any abnormal vital sign, reporting some spotting along with heavy bleeding last week, does not get a period because she has had a Mirena IUD for the past 4 years.  CMP with no electrolyte abnormality, creatinine levels within normal limits.  LFTs unremarkable.  CBC with a leukocytosis of 12.  Hemoglobin is normal without any signs of anemia.  hCG is positive at 52, 830 we proceeded with ultrasound  Ultrasound showed: Single, viable intrauterine pregnancy at approximately 7 weeks and 4  days gestation by ultrasound evaluation.      Unfortunately due to childcare constraints patient had to leave before receiving her results, I did agree to call her in order to review these results with her. 7:18 PM I personally called patient at [phone] multiple attempts however patient did not pick up the phone.  She does have an IUP, and she will follow-up with OB/GYN as we discussed prior to her discharge.   Portions of this note were generated with Scientist, clinical (histocompatibility and immunogenetics). Dictation errors may occur despite best attempts at proofreading.   Final Clinical Impression(s) / ED Diagnoses Final diagnoses:  Less than [redacted] weeks gestation of pregnancy    Rx / DC Orders ED Discharge Orders     None         Claude Manges, PA-C 04/20/23 1953    Tegeler, Canary Brim,  MD 04/22/23 [phone]

## 2023-04-20 NOTE — ED Provider Triage Note (Signed)
Emergency Medicine Provider Triage Evaluation Note  Ashley Tate , a 32 y.o. female  was evaluated in triage.  Pt complains of taking 3 at home pregnancy test and reports she has an IUD in place. Endorses breast tenderness. No new nausea, vomiting. No fevers or chills, no abdominal pain.   Review of Systems  Positive: As above Negative: As above  Physical Exam  BP 133/87 (BP Location: Left Arm)   Pulse (!) 102   Temp 98.7 F (37.1 C) (Oral)   Resp 17   Ht 5\' 3"  (1.6 m)   Wt 94.4 kg   SpO2 99%   BMI 36.88 kg/m  Gen:   Awake, no distress   Resp:  Normal effort  MSK:   Moves extremities without difficulty    Medical Decision Making  Medically screening exam initiated at 1:21 PM.  Appropriate orders placed.  Siah [place] was informed that the remainder of the evaluation will be completed by another provider, this initial triage assessment does not replace that evaluation, and the importance of remaining in the ED until their evaluation is complete.     Arabella Merles, PA-C 04/20/23 1326

## 2023-05-28 LAB — OB RESULTS CONSOLE HEPATITIS B SURFACE ANTIGEN: Hepatitis B Surface Ag: NEGATIVE

## 2023-05-28 LAB — OB RESULTS CONSOLE RUBELLA ANTIBODY, IGM: Rubella: IMMUNE

## 2023-05-28 LAB — OB RESULTS CONSOLE RPR: RPR: NONREACTIVE

## 2023-05-28 LAB — OB RESULTS CONSOLE HIV ANTIBODY (ROUTINE TESTING): HIV: NONREACTIVE

## 2023-06-24 DIAGNOSIS — Z3A16 16 weeks gestation of pregnancy: Secondary | ICD-10-CM | POA: Diagnosis not present

## 2023-06-24 DIAGNOSIS — Z363 Encounter for antenatal screening for malformations: Secondary | ICD-10-CM | POA: Diagnosis not present

## 2023-07-01 ENCOUNTER — Other Ambulatory Visit: Payer: Self-pay | Admitting: Obstetrics and Gynecology

## 2023-07-01 DIAGNOSIS — Z363 Encounter for antenatal screening for malformations: Secondary | ICD-10-CM

## 2023-07-21 DIAGNOSIS — O99210 Obesity complicating pregnancy, unspecified trimester: Secondary | ICD-10-CM | POA: Insufficient documentation

## 2023-07-30 ENCOUNTER — Other Ambulatory Visit: Payer: Self-pay | Admitting: *Deleted

## 2023-07-30 ENCOUNTER — Ambulatory Visit: Payer: Managed Care, Other (non HMO) | Admitting: *Deleted

## 2023-07-30 ENCOUNTER — Ambulatory Visit: Payer: Managed Care, Other (non HMO) | Attending: Obstetrics and Gynecology

## 2023-07-30 ENCOUNTER — Other Ambulatory Visit: Payer: Self-pay

## 2023-07-30 VITALS — BP 124/64 | HR 96

## 2023-07-30 DIAGNOSIS — O99210 Obesity complicating pregnancy, unspecified trimester: Secondary | ICD-10-CM | POA: Insufficient documentation

## 2023-07-30 DIAGNOSIS — Z363 Encounter for antenatal screening for malformations: Secondary | ICD-10-CM | POA: Diagnosis present

## 2023-07-30 DIAGNOSIS — O99212 Obesity complicating pregnancy, second trimester: Secondary | ICD-10-CM

## 2023-09-28 DIAGNOSIS — Z3483 Encounter for supervision of other normal pregnancy, third trimester: Secondary | ICD-10-CM | POA: Diagnosis not present

## 2023-09-28 DIAGNOSIS — Z888 Allergy status to other drugs, medicaments and biological substances status: Secondary | ICD-10-CM | POA: Diagnosis not present

## 2023-09-28 DIAGNOSIS — Z3A30 30 weeks gestation of pregnancy: Secondary | ICD-10-CM | POA: Diagnosis not present

## 2023-09-28 DIAGNOSIS — J45909 Unspecified asthma, uncomplicated: Secondary | ICD-10-CM | POA: Diagnosis not present

## 2023-09-28 DIAGNOSIS — Z331 Pregnant state, incidental: Secondary | ICD-10-CM | POA: Diagnosis not present

## 2023-09-28 DIAGNOSIS — Z23 Encounter for immunization: Secondary | ICD-10-CM | POA: Diagnosis not present

## 2023-10-08 ENCOUNTER — Other Ambulatory Visit: Payer: Self-pay | Admitting: *Deleted

## 2023-10-08 ENCOUNTER — Ambulatory Visit: Payer: Commercial Managed Care - HMO | Attending: Obstetrics and Gynecology

## 2023-10-08 DIAGNOSIS — Z3A32 32 weeks gestation of pregnancy: Secondary | ICD-10-CM

## 2023-10-08 DIAGNOSIS — E669 Obesity, unspecified: Secondary | ICD-10-CM

## 2023-10-08 DIAGNOSIS — O99213 Obesity complicating pregnancy, third trimester: Secondary | ICD-10-CM

## 2023-10-08 DIAGNOSIS — O99212 Obesity complicating pregnancy, second trimester: Secondary | ICD-10-CM | POA: Diagnosis not present

## 2023-10-13 NOTE — L&D Delivery Note (Signed)
Delivery Note    Patient Name: Ashley Tate DOB: 06-15-1991 MRN: [id number]  Date of admission: 12/01/2023 Delivering MD:   Date of delivery: 12/01/2023 Type of delivery: SVD  Newborn Data: Live born female  Birth Weight:   APGAR: 9, 9   Newborn Delivery   Birth date/time: 12/01/2023 19:10:00 Delivery type: Vaginal, Spontaneous    [name], 32 y.o., @ [redacted]w[redacted]d,  [id number], who was admitted for srom an dlatent labor with gbs-. I was called to the room when she progressed 2+ station in the second stage of labor.  She pushed for 6/min.  She delivered a viable infant, cephalic and restituted to the ROA position over an intact perineum.  A nuchal cord   was not identified. The baby was placed on maternal abdomen while initial step of NRP were perfmored (Dry, Stimulated, and warmed). Hat placed on baby for thermoregulation. Delayed cord clamping was performed for 2 minutes.  Cord double clamped and cut.  Cord cut by Myself. Apgar scores were 9 and 9. Prophylactic Pitocin was started in the third stage of labor for active management. The placenta delivered spontaneously, shultz, with a 3 vessel cord and was sent to LD.  Inspection revealed none. An examination of the vaginal vault and cervix was free from lacerations. The uterus was firm, bleeding stable.   Placenta and umbilical artery blood gas were not sent.  There were no complications during the procedure.  Mom and baby skin to skin following delivery. Left in stable condition.  Maternal Info: Anesthesia: Epidural Episiotomy: no Lacerations:  no Suture Repair: no Est. Blood Loss (mL):   Newborn Info:  Baby Sex: female Circumcision: N/A Babies Name: AVA APGAR (1 MIN):  9 APGAR (5 MINS):  9 APGAR (10 MINS):     Mom to postpartum.  Baby to Couplet care / Skin to Skin.  Delivery Report:    Review the Delivery Report for details.   [facility] CNM, FNP-C, PMHNP-BC  [id number]  [place], [place] [address]  Cell:  [phone]  Office Phone: [phone] Fax: [phone] 12/01/2023  7:30 PM

## 2023-10-19 DIAGNOSIS — Z23 Encounter for immunization: Secondary | ICD-10-CM | POA: Diagnosis not present

## 2023-10-19 DIAGNOSIS — J45909 Unspecified asthma, uncomplicated: Secondary | ICD-10-CM | POA: Diagnosis not present

## 2023-10-19 DIAGNOSIS — Z3A33 33 weeks gestation of pregnancy: Secondary | ICD-10-CM | POA: Diagnosis not present

## 2023-10-19 DIAGNOSIS — Z888 Allergy status to other drugs, medicaments and biological substances status: Secondary | ICD-10-CM | POA: Diagnosis not present

## 2023-10-19 DIAGNOSIS — Z331 Pregnant state, incidental: Secondary | ICD-10-CM | POA: Diagnosis not present

## 2023-11-11 DIAGNOSIS — Z331 Pregnant state, incidental: Secondary | ICD-10-CM | POA: Diagnosis not present

## 2023-11-11 DIAGNOSIS — Z3A36 36 weeks gestation of pregnancy: Secondary | ICD-10-CM | POA: Diagnosis not present

## 2023-11-11 DIAGNOSIS — J45909 Unspecified asthma, uncomplicated: Secondary | ICD-10-CM | POA: Diagnosis not present

## 2023-11-11 DIAGNOSIS — Z20828 Contact with and (suspected) exposure to other viral communicable diseases: Secondary | ICD-10-CM | POA: Diagnosis not present

## 2023-11-11 DIAGNOSIS — Z888 Allergy status to other drugs, medicaments and biological substances status: Secondary | ICD-10-CM | POA: Diagnosis not present

## 2023-11-11 DIAGNOSIS — O368199 Decreased fetal movements, unspecified trimester, other fetus: Secondary | ICD-10-CM | POA: Diagnosis not present

## 2023-11-11 DIAGNOSIS — Z362 Encounter for other antenatal screening follow-up: Secondary | ICD-10-CM | POA: Diagnosis not present

## 2023-11-11 DIAGNOSIS — Z124 Encounter for screening for malignant neoplasm of cervix: Secondary | ICD-10-CM | POA: Diagnosis not present

## 2023-11-11 LAB — OB RESULTS CONSOLE GBS: GBS: NEGATIVE

## 2023-11-12 ENCOUNTER — Ambulatory Visit: Payer: Medicaid Other | Attending: Maternal & Fetal Medicine

## 2023-11-12 ENCOUNTER — Ambulatory Visit: Payer: Medicaid Other | Admitting: *Deleted

## 2023-11-12 VITALS — BP 133/80 | HR 81

## 2023-11-12 DIAGNOSIS — Z3A37 37 weeks gestation of pregnancy: Secondary | ICD-10-CM

## 2023-11-12 DIAGNOSIS — E669 Obesity, unspecified: Secondary | ICD-10-CM

## 2023-11-12 DIAGNOSIS — O99213 Obesity complicating pregnancy, third trimester: Secondary | ICD-10-CM | POA: Diagnosis not present

## 2023-11-15 DIAGNOSIS — Z3A37 37 weeks gestation of pregnancy: Secondary | ICD-10-CM | POA: Diagnosis not present

## 2023-11-15 DIAGNOSIS — J45909 Unspecified asthma, uncomplicated: Secondary | ICD-10-CM | POA: Diagnosis not present

## 2023-11-15 DIAGNOSIS — N898 Other specified noninflammatory disorders of vagina: Secondary | ICD-10-CM | POA: Diagnosis not present

## 2023-11-15 DIAGNOSIS — Z331 Pregnant state, incidental: Secondary | ICD-10-CM | POA: Diagnosis not present

## 2023-11-15 DIAGNOSIS — N760 Acute vaginitis: Secondary | ICD-10-CM | POA: Diagnosis not present

## 2023-11-15 DIAGNOSIS — Z888 Allergy status to other drugs, medicaments and biological substances status: Secondary | ICD-10-CM | POA: Diagnosis not present

## 2023-11-29 DIAGNOSIS — Z3A39 39 weeks gestation of pregnancy: Secondary | ICD-10-CM | POA: Diagnosis not present

## 2023-11-29 DIAGNOSIS — O481 Prolonged pregnancy: Secondary | ICD-10-CM | POA: Diagnosis not present

## 2023-11-29 DIAGNOSIS — Z888 Allergy status to other drugs, medicaments and biological substances status: Secondary | ICD-10-CM | POA: Diagnosis not present

## 2023-11-29 DIAGNOSIS — J45909 Unspecified asthma, uncomplicated: Secondary | ICD-10-CM | POA: Diagnosis not present

## 2023-11-29 DIAGNOSIS — Z331 Pregnant state, incidental: Secondary | ICD-10-CM | POA: Diagnosis not present

## 2023-11-29 DIAGNOSIS — N760 Acute vaginitis: Secondary | ICD-10-CM | POA: Diagnosis not present

## 2023-12-01 ENCOUNTER — Encounter (HOSPITAL_COMMUNITY): Payer: Self-pay | Admitting: Obstetrics and Gynecology

## 2023-12-01 ENCOUNTER — Inpatient Hospital Stay (HOSPITAL_COMMUNITY)
Admission: AD | Admit: 2023-12-01 | Discharge: 2023-12-02 | DRG: 798 | Disposition: A | Discharge status: Home / Self Care | Payer: Medicaid Other | Attending: Obstetrics and Gynecology | Admitting: Obstetrics and Gynecology

## 2023-12-01 ENCOUNTER — Inpatient Hospital Stay (HOSPITAL_COMMUNITY): Payer: Medicaid Other | Admitting: Anesthesiology

## 2023-12-01 ENCOUNTER — Other Ambulatory Visit: Payer: Self-pay

## 2023-12-01 DIAGNOSIS — O99334 Smoking (tobacco) complicating childbirth: Secondary | ICD-10-CM | POA: Diagnosis present

## 2023-12-01 DIAGNOSIS — O4292 Full-term premature rupture of membranes, unspecified as to length of time between rupture and onset of labor: Secondary | ICD-10-CM

## 2023-12-01 DIAGNOSIS — Z3A39 39 weeks gestation of pregnancy: Secondary | ICD-10-CM | POA: Diagnosis not present

## 2023-12-01 DIAGNOSIS — J45909 Unspecified asthma, uncomplicated: Secondary | ICD-10-CM | POA: Diagnosis present

## 2023-12-01 DIAGNOSIS — F17210 Nicotine dependence, cigarettes, uncomplicated: Secondary | ICD-10-CM | POA: Diagnosis not present

## 2023-12-01 DIAGNOSIS — O99214 Obesity complicating childbirth: Principal | ICD-10-CM | POA: Diagnosis present

## 2023-12-01 DIAGNOSIS — Z886 Allergy status to analgesic agent status: Secondary | ICD-10-CM

## 2023-12-01 DIAGNOSIS — Z9851 Tubal ligation status: Principal | ICD-10-CM

## 2023-12-01 DIAGNOSIS — Z833 Family history of diabetes mellitus: Secondary | ICD-10-CM

## 2023-12-01 DIAGNOSIS — Z302 Encounter for sterilization: Secondary | ICD-10-CM | POA: Diagnosis not present

## 2023-12-01 DIAGNOSIS — O9952 Diseases of the respiratory system complicating childbirth: Secondary | ICD-10-CM | POA: Diagnosis not present

## 2023-12-01 DIAGNOSIS — O26893 Other specified pregnancy related conditions, third trimester: Secondary | ICD-10-CM | POA: Diagnosis not present

## 2023-12-01 LAB — CBC
HCT: 38.1 % (ref 36.0–46.0)
Hemoglobin: 12.9 g/dL (ref 12.0–15.0)
MCH: 30.3 pg (ref 26.0–34.0)
MCHC: 33.9 g/dL (ref 30.0–36.0)
MCV: 89.4 fL (ref 80.0–100.0)
Platelets: 194 10*3/uL (ref 150–400)
RBC: 4.26 MIL/uL (ref 3.87–5.11)
RDW: 13.6 % (ref 11.5–15.5)
WBC: 14 10*3/uL — ABNORMAL HIGH (ref 4.0–10.5)
nRBC: 0 % (ref 0.0–0.2)

## 2023-12-01 LAB — TYPE AND SCREEN
ABO/RH(D): A POS
Antibody Screen: NEGATIVE

## 2023-12-01 LAB — POCT FERN TEST: POCT Fern Test: NEGATIVE

## 2023-12-01 MED ORDER — DIBUCAINE (PERIANAL) 1 % EX OINT: 1.0000 | TOPICAL_OINTMENT | CUTANEOUS | Status: DC | PRN

## 2023-12-01 MED ORDER — EPHEDRINE 5 MG/ML INJ: 10.0000 mg | INTRAVENOUS | Status: DC | PRN

## 2023-12-01 MED ORDER — WITCH HAZEL-GLYCERIN EX PADS: 1.0000 | MEDICATED_PAD | CUTANEOUS | Status: DC | PRN

## 2023-12-01 MED ORDER — OXYTOCIN BOLUS FROM INFUSION
333.0000 mL | Freq: Once | INTRAVENOUS | Status: AC
  Administered 2023-12-01: 333 mL via INTRAVENOUS

## 2023-12-01 MED ORDER — ONDANSETRON HCL 4 MG/2ML IJ SOLN: 4.0000 mg | INTRAMUSCULAR | Status: DC | PRN

## 2023-12-01 MED ORDER — PHENYLEPHRINE 80 MCG/ML (10ML) SYRINGE FOR IV PUSH (FOR BLOOD PRESSURE SUPPORT): 80.0000 ug | PREFILLED_SYRINGE | INTRAVENOUS | Status: DC | PRN

## 2023-12-01 MED ORDER — LACTATED RINGERS IV SOLN: 500.0000 mL | Freq: Once | INTRAVENOUS | Status: DC

## 2023-12-01 MED ORDER — LIDOCAINE HCL (PF) 1 % IJ SOLN: 30.0000 mL | INTRAMUSCULAR | Status: DC | PRN

## 2023-12-01 MED ORDER — LIDOCAINE HCL (PF) 1 % IJ SOLN
INTRAMUSCULAR | Status: DC | PRN
  Administered 2023-12-01: 6 mL via EPIDURAL
  Administered 2023-12-01: 4 mL via EPIDURAL

## 2023-12-01 MED ORDER — ZOLPIDEM TARTRATE 5 MG PO TABS: 5.0000 mg | ORAL_TABLET | Freq: Every evening | ORAL | Status: DC | PRN

## 2023-12-01 MED ORDER — COCONUT OIL OIL: 1.0000 | TOPICAL_OIL | Status: DC | PRN

## 2023-12-01 MED ORDER — BENZOCAINE-MENTHOL 20-0.5 % EX AERO
1.0000 | INHALATION_SPRAY | CUTANEOUS | Status: DC | PRN
  Filled 2023-12-01: qty 56

## 2023-12-01 MED ORDER — OXYCODONE-ACETAMINOPHEN 5-325 MG PO TABS: 1.0000 | ORAL_TABLET | ORAL | Status: DC | PRN

## 2023-12-01 MED ORDER — PRENATAL MULTIVITAMIN CH
1.0000 | ORAL_TABLET | Freq: Every day | ORAL | Status: DC
Start: 2023-12-02 — End: 2023-12-03
  Administered 2023-12-02: 1 via ORAL
  Filled 2023-12-01: qty 1

## 2023-12-01 MED ORDER — ACETAMINOPHEN 325 MG PO TABS: 650.0000 mg | ORAL_TABLET | ORAL | Status: DC | PRN

## 2023-12-01 MED ORDER — SENNOSIDES-DOCUSATE SODIUM 8.6-50 MG PO TABS
2.0000 | ORAL_TABLET | Freq: Every day | ORAL | Status: DC
  Administered 2023-12-02: 2 via ORAL
  Filled 2023-12-01: qty 2

## 2023-12-01 MED ORDER — FENTANYL-BUPIVACAINE-NACL 0.5-0.125-0.9 MG/250ML-% EP SOLN
12.0000 mL/h | EPIDURAL | Status: DC | PRN
  Administered 2023-12-01: 12 mL/h via EPIDURAL
  Filled 2023-12-01: qty 250

## 2023-12-01 MED ORDER — ACETAMINOPHEN 325 MG PO TABS
650.0000 mg | ORAL_TABLET | ORAL | Status: DC | PRN
  Administered 2023-12-02 (×2): 650 mg via ORAL
  Filled 2023-12-01 (×2): qty 2

## 2023-12-01 MED ORDER — SIMETHICONE 80 MG PO CHEW: 80.0000 mg | CHEWABLE_TABLET | ORAL | Status: DC | PRN

## 2023-12-01 MED ORDER — LACTATED RINGERS IV SOLN: INTRAVENOUS | Status: DC

## 2023-12-01 MED ORDER — SOD CITRATE-CITRIC ACID 500-334 MG/5ML PO SOLN: 30.0000 mL | ORAL | Status: DC | PRN

## 2023-12-01 MED ORDER — TETANUS-DIPHTH-ACELL PERTUSSIS 5-2.5-18.5 LF-MCG/0.5 IM SUSY
0.5000 mL | PREFILLED_SYRINGE | Freq: Once | INTRAMUSCULAR | Status: DC
  Filled 2023-12-01: qty 0.5

## 2023-12-01 MED ORDER — DIPHENHYDRAMINE HCL 50 MG/ML IJ SOLN: 12.5000 mg | INTRAMUSCULAR | Status: DC | PRN

## 2023-12-01 MED ORDER — ONDANSETRON HCL 4 MG PO TABS: 4.0000 mg | ORAL_TABLET | ORAL | Status: DC | PRN

## 2023-12-01 MED ORDER — PHENYLEPHRINE 80 MCG/ML (10ML) SYRINGE FOR IV PUSH (FOR BLOOD PRESSURE SUPPORT)
80.0000 ug | PREFILLED_SYRINGE | INTRAVENOUS | Status: DC | PRN
Start: 2023-12-01 — End: 2023-12-01

## 2023-12-01 MED ORDER — ONDANSETRON HCL 4 MG/2ML IJ SOLN: 4.0000 mg | Freq: Four times a day (QID) | INTRAMUSCULAR | Status: DC | PRN

## 2023-12-01 MED ORDER — OXYCODONE-ACETAMINOPHEN 5-325 MG PO TABS: 2.0000 | ORAL_TABLET | ORAL | Status: DC | PRN

## 2023-12-01 MED ORDER — FENTANYL CITRATE (PF) 100 MCG/2ML IJ SOLN: 50.0000 ug | INTRAMUSCULAR | Status: DC | PRN

## 2023-12-01 MED ORDER — LACTATED RINGERS IV SOLN: 500.0000 mL | INTRAVENOUS | Status: DC | PRN

## 2023-12-01 MED ORDER — IBUPROFEN 600 MG PO TABS
600.0000 mg | ORAL_TABLET | Freq: Four times a day (QID) | ORAL | Status: DC
  Administered 2023-12-01 – 2023-12-02 (×4): 600 mg via ORAL
  Filled 2023-12-01 (×4): qty 1

## 2023-12-01 MED ORDER — TERBUTALINE SULFATE 1 MG/ML IJ SOLN: 0.2500 mg | Freq: Once | INTRAMUSCULAR | Status: DC | PRN

## 2023-12-01 MED ORDER — OXYTOCIN-SODIUM CHLORIDE 30-0.9 UT/500ML-% IV SOLN
2.5000 [IU]/h | INTRAVENOUS | Status: DC
  Filled 2023-12-01: qty 500

## 2023-12-01 MED ORDER — OXYTOCIN-SODIUM CHLORIDE 30-0.9 UT/500ML-% IV SOLN
1.0000 m[IU]/min | INTRAVENOUS | Status: DC
Start: 2023-12-01 — End: 2023-12-01
  Administered 2023-12-01: 2 m[IU]/min via INTRAVENOUS

## 2023-12-01 MED ORDER — DIPHENHYDRAMINE HCL 25 MG PO CAPS: 25.0000 mg | ORAL_CAPSULE | Freq: Four times a day (QID) | ORAL | Status: DC | PRN

## 2023-12-01 NOTE — Anesthesia Procedure Notes (Signed)
Epidural Patient location during procedure: OB Start time: 12/01/2023 5:29 PM End time: 12/01/2023 5:36 PM  Staffing Anesthesiologist: Beryle Lathe, MD Performed: anesthesiologist   Preanesthetic Checklist Completed: patient identified, IV checked, risks and benefits discussed, monitors and equipment checked, pre-op evaluation and timeout performed  Epidural Patient position: sitting Prep: DuraPrep Patient monitoring: continuous pulse ox and blood pressure Approach: midline Location: L2-L3 Injection technique: LOR saline  Needle:  Needle type: Tuohy  Needle gauge: 17 G Needle length: 9 cm Needle insertion depth: 8 cm Catheter size: 19 Gauge Catheter at skin depth: 13 cm Test dose: negative and Other (1% lidocaine)  Assessment Events: blood not aspirated and no cerebrospinal fluid  Additional Notes Patient identified. Risks including, but not limited to, bleeding, infection, nerve damage, paralysis, inadequate analgesia, blood pressure changes, nausea, vomiting, allergic reaction, postpartum back pain, itching, and headache were discussed. Patient expressed understanding and wished to proceed. Sterile prep and drape, including hand hygiene, mask, and sterile gloves were used. The patient was positioned and the spine was prepped. The skin was anesthetized with lidocaine. No paraesthesia or other complication noted. The patient did not experience any signs of intravascular injection such as tinnitus or metallic taste in mouth, nor signs of intrathecal spread such as rapid motor block. Please see nursing notes for vital signs. The patient tolerated the procedure well.   Leslye Peer, MDReason for block:procedure for pain

## 2023-12-01 NOTE — H&P (Signed)
Ashley Tate is a 33 y.o. female, [id number], IUP at 39.5 weeks, presenting for SROM in latent labor. GBS-. Asthma on Porair PRN. Desire PP tubal. Obesity BMI at NOB 39.3. Pt endorse + Fm. Denies vaginal bleeding.    Patient Active Problem List   Diagnosis Date Noted   Obesity affecting pregnancy, antepartum 07/21/2023     Active Ambulatory Problems    Diagnosis Date Noted   Obesity affecting pregnancy, antepartum 07/21/2023   Resolved Ambulatory Problems    Diagnosis Date Noted   Breech presentation 11/03/2018   Normal labor 11/03/2018   Breech presentation delivered 11/03/2018   Normal postpartum course 11/04/2018   Past Medical History:  Diagnosis Date   Asthma    Pap smear abnormality of vagina with ASC-US       Medications Prior to Admission  Medication Sig Dispense Refill Last Dose/Taking   metroNIDAZOLE (FLAGYL) 500 MG tablet Take 500 mg by mouth 3 (three) times daily.   12/01/2023   Multiple Vitamin (MULTIVITAMIN WITH MINERALS) TABS tablet Take 1 tablet by mouth daily.   12/01/2023   albuterol (PROVENTIL HFA;VENTOLIN HFA) 108 (90 Base) MCG/ACT inhaler Inhale 2 puffs into the lungs every 6 (six) hours as needed for wheezing or shortness of breath. 1 Inhaler 0    cholecalciferol (VITAMIN D3) 25 MCG (1000 UNIT) tablet Take 2,000 Units by mouth daily.       Past Medical History:  Diagnosis Date   Asthma    Breech presentation delivered 11/03/2018   Normal postpartum course 11/04/2018   Pap smear abnormality of vagina with ASC-US      No current facility-administered medications on file prior to encounter.   Current Outpatient Medications on File Prior to Encounter  Medication Sig Dispense Refill   metroNIDAZOLE (FLAGYL) 500 MG tablet Take 500 mg by mouth 3 (three) times daily.     Multiple Vitamin (MULTIVITAMIN WITH MINERALS) TABS tablet Take 1 tablet by mouth daily.     albuterol (PROVENTIL HFA;VENTOLIN HFA) 108 (90 Base) MCG/ACT inhaler Inhale 2 puffs into the  lungs every 6 (six) hours as needed for wheezing or shortness of breath. 1 Inhaler 0   cholecalciferol (VITAMIN D3) 25 MCG (1000 UNIT) tablet Take 2,000 Units by mouth daily.       Allergies  Allergen Reactions   Aspirin Nausea Only    History of present pregnancy: Pt Info/Preference:  Screening/Consents:  Labs:   EDD: Estimated Date of Delivery: 12/03/23  Establised: No LMP recorded. Patient is pregnant.  Anatomy Scan: Date: 10/18 @ 22 weeks Placenta Location: fundal Genetic Screen: Panoroma:LR AFP: Neg First Tri: Quad: Horizoan: neg  Office: [facility]             First PNV: 13 weeks Blood Type  A+  Language: english Last PNV: 39.3 weeks Rhogam  N/A  Flu Vaccine:  UTD   Antibody  Neg  TDaP vaccine UTD   GTT: Early: 5.2 Third Trimester: 120  Feeding Plan: breast BTL: Desires PP consent signed 10/19/2023 Rubella:  Immune 8/16  Contraception: PP tubale desired VBAC: no RPR:   NR 8/16  Circumcision: N?A female   HBsAg:  neg  Pediatrician:  ???   HIV:   NR  Prenatal Classes: no Additional [place]: 37 weeks, afi 15, 56%, 5.14lbs,  GBS:  (For PCN allergy, check sensitivities)       Chlamydia: neg    MFM Referral/Consult:  GC: neg  Support Person: parnter   PAP: 2019 HRHPV no colp, Need PP  PAP  Pain Management: epidural Neonatologist Referral:  Hgb Electrophoresis:  AA  Birth Plan: DCC   Hgb NOB: 12.7    28W: 12.4   OB History     Gravida  3   Para  2   Term  2   Preterm  0   AB  0   Living  2      SAB  0   IAB  0   Ectopic  0   Multiple  0   Live Births  2          Past Medical History:  Diagnosis Date   Asthma    Breech presentation delivered 11/03/2018   Normal postpartum course 11/04/2018   Pap smear abnormality of vagina with ASC-US    Past Surgical History:  Procedure Laterality Date   NO PAST SURGERIES     Family History: family history includes Cancer in an other family member; Diabetes in her maternal grandfather; Kidney disease in her mother. Social  History:  reports that she has been smoking cigarettes. She has never used smokeless tobacco. She reports that she does not currently use alcohol. She reports that she does not currently use drugs after having used the following drugs: Marijuana.   Prenatal Transfer Tool  Maternal Diabetes: No Genetic Screening: Normal Maternal Ultrasounds/Referrals: Normal Fetal Ultrasounds or other Referrals:  None Maternal Substance Abuse:  No Significant Maternal Medications:  None Significant Maternal Lab Results: Group B Strep negative  ROS:  Review of Systems  Constitutional: Negative.   HENT: Negative.    Eyes: Negative.   Respiratory: Negative.    Cardiovascular: Negative.   Gastrointestinal:  Positive for abdominal pain.  Genitourinary:        Leakage of fluid   Skin: Negative.   Neurological: Negative.   Endo/Heme/Allergies: Negative.   Psychiatric/Behavioral: Negative.       Physical Exam: Pulse (!) 101   Temp 98.2 F (36.8 C)   Resp 17   SpO2 100%   Physical Exam Constitutional:      Appearance: Normal appearance.  HENT:     Head: Normocephalic.     Right Ear: Tympanic membrane normal.     Nose: Nose normal.     Mouth/Throat:     Pharynx: Oropharynx is clear.  Eyes:     Conjunctiva/sclera: Conjunctivae normal.  Cardiovascular:     Rate and Rhythm: Normal rate and regular rhythm.     Pulses: Normal pulses.     Heart sounds: Normal heart sounds.  Pulmonary:     Effort: Pulmonary effort is normal.     Breath sounds: Normal breath sounds.  Abdominal:     General: Bowel sounds are normal.  Genitourinary:    Comments: Uterus gravida pelvis adequate  Musculoskeletal:        General: Normal range of motion.     Cervical back: Normal range of motion and neck supple.  Skin:    General: Skin is warm and dry.     Capillary Refill: Capillary refill takes less than 2 seconds.  Neurological:     Mental Status: She is alert.  Psychiatric:        Mood and Affect: Mood  normal.      NST: FHR baseline 150 bpm, Variability: moderate, Accelerations:present, Decelerations:  Absent= Cat 1/Reactive UC:   irregular, every 2-4 minutes SVE:   Dilation: 5.5 Effacement (%): 90, vertex verified by fetal sutures.  Leopold's: Position vertex, EFW 6.5lbs via leopold's.   Labs: Results for  orders placed or performed during the hospital encounter of 12/01/23 (from the past 24 hours)  Fern Test     Status: Normal   Collection Time: 12/01/23  4:11 PM  Result Value Ref Range   POCT Fern Test Negative = intact amniotic membranes     Imaging:  [place] MFM OB FOLLOW UP Result Date: 11/12/2023 ----------------------------------------------------------------------  OBSTETRICS REPORT                       (Signed Final 11/12/2023 12:11 pm) ---------------------------------------------------------------------- Patient Info  ID #:       [id number]                          D.O.B.:  1991/03/29 (32 yrs)(F)  Name:       [name]                   Visit Date: 11/12/2023 09:16 am ---------------------------------------------------------------------- Performed By  Attending:        Ma Rings MD         Ref. Address:     [facility]                                                             Gynecology                                                             [address]                                                             [address]                                                             [place], [place]  [address]  Performed By:     Isac Sarna        Location[facility]                    BS [facility]                                  [facility]  Referred By:      Nigel Bridgeman                    CNM ---------------------------------------------------------------------- Orders  #  Description                           Code        Ordered By  1  [place] MFM OB FOLLOW UP                   72536.64    Braxton Feathers ----------------------------------------------------------------------  #  Order #                     Accession #                Episode #  1  [id number]                   [id number]                 [id number] ---------------------------------------------------------------------- Indications  Obesity complicating pregnancy, third          O99.213  trimester  Encounter for other antenatal screening        Z36.2  follow-up  LR NIPS - Female, Negative Horizon,  Negative AFP  [redacted] weeks gestation of pregnancy                Z3A.37 ---------------------------------------------------------------------- Fetal Evaluation  Num Of Fetuses:         1  Fetal Heart Rate(bpm):  137  Cardiac Activity:       Observed  Presentation:           Cephalic  Placenta:               Fundal  P. Cord Insertion:      Not well visualized  Amniotic Fluid  AFI FV:      Within normal limits  AFI Sum(cm)     %Tile       Largest Pocket(cm)  15.04           56          3.91  RUQ(cm)  RLQ(cm)       LUQ(cm)        LLQ(cm)  3.91          3.62          3.6            3.91 ---------------------------------------------------------------------- Biometry  BPD:      86.7  mm     G. Age:  35w 0d         14  %    CI:        79.56   %    70 - 86                                                          FL/HC:      21.3   %    20.8 - 22.6  HC:      307.2  mm     G. Age:  34w 2d        < 1  %    HC/AC:      0.94        0.92 - 1.05  AC:      326.9  mm     G. Age:  36w 4d         52  %    FL/BPD:     75.3   %    71 - 87  FL:       65.3  mm     G. Age:  33w 5d        < 1  %    FL/AC:      20.0   %    20 - 24  LV:        5.6  mm  Est. FW:    2675  gm    5 lb 14 oz      18  %  ---------------------------------------------------------------------- OB History  Gravidity:    4         Term:   2        Prem:   0        SAB:   1  TOP:          0       Ectopic:  0        Living: 2 ---------------------------------------------------------------------- Gestational Age  U/S Today:     34w 6d                                        EDD:   12/18/23  Best:          37w 0d     Det. ByMarcella Dubs         EDD:   12/03/23                                      (04/20/23) ---------------------------------------------------------------------- Anatomy  Cranium:               Previously seen        Aortic Arch:  Previously seen  Cavum:                 Previously seen        Ductal Arch:            Previously seen  Ventricles:            Appears normal         Diaphragm:              Appears normal  Choroid Plexus:        Previously seen        Stomach:                Appears normal, left                                                                        sided  Cerebellum:            Previously seen        Abdomen:                Previously seen  Posterior Fossa:       Previously seen        Abdominal Wall:         Previously seen  Face:                  Orbits and profile     Cord Vessels:           Previously seen                         previously seen  Lips:                  Previously seen        Kidneys:                Appear normal  Thoracic:              Previously seen        Bladder:                Appears normal  Heart:                 Previously seen        Spine:                  Previously seen  RVOT:                  Previously seen        Upper Extremities:      Previously seen  LVOT:                  Previously seen        Lower Extremities:      Previously seen ---------------------------------------------------------------------- Comments  This patient was seen for a follow up growth scan due to  maternal obesity.  She denies any problems since her last  exam.  She was  informed that the fetal growth and amniotic fluid  level appears appropriate for her gestational age.  As the fetal growth is within normal limits, no further exams  were  scheduled in our office. ----------------------------------------------------------------------                   Ma Rings, MD Electronically Signed Final Report   11/12/2023 12:11 pm ----------------------------------------------------------------------    MAU Course: Orders Placed This Encounter  Procedures   Rupture of Membrane (ROM) Plus   Diet clear liquid Room service appropriate? Yes; Fluid consistency: Thin   Cervical Exam   Insert urethral catheter X 1 PRN If Coude Catheter is chosen, qualified resources by campus can be found in the clinical skills nursing procedure for Coude Catheter 1. If straight catheterized > 2 times or patient unable to void post epidural plac...   Fern Test   No orders of the defined types were placed in this encounter.   Assessment/Plan: Linetta Regner is a 33 y.o. female, G4P2012, IUP at 39.5 weeks, presenting for SROM in latent labor. GBS-. Asthma on Porair PRN. Desire PP tubal. Obesity BMI at NOB 39.3. Pt endorse + Fm. Denies vaginal bleeding.  FWB: Cat 1 Fetal Tracing.   Plan: Admit to Birthing Suite per consult with DR Su Hilt Routine [facility] orders Desire PP tubal consent signed 10/19/2023.  Pain med/epidural prn Expectant management.  Anticipate labor progression   [facility] CNM, FNP-C, PMHNP-BC  [address]  [place], [place] [address]  Cell: [phone]  Office Phone: [phone] Fax: [phone] 12/01/2023  4:45 PM

## 2023-12-01 NOTE — Lactation Note (Signed)
This note was copied from a baby's chart. Lactation Consultation Note  Patient Name: Ashley Tate [id number] Date: 12/01/2023 Age:33 years Reason for consult: Initial assessment;Term  P3- MOB reports that infant is nursing very well and very strong. MOB denies experiencing any pain or pinching when infant nurses. MOB reports that infant averages 15 minutes on the breast at a time. MOB plans to offer both breast milk and formula. MOB reports that her colostrum doesn't seem like it's enough for infant, so she will be supplementing with formula after every feeding. MOB declines having LC team follow up due to having breastfeeding experience. MOB knows to still call for help if needed.  LC reviewed feeding infant on cue 8-12x in 24 hrs, not allowing infant to go over 3 hrs without a feeding, CDC milk storage guidelines and LC services handout. LC encouraged MOB to call for further assistance as needed.  Maternal Data Has patient been taught Hand Expression?: Yes Does the patient have breastfeeding experience prior to this delivery?: Yes How long did the patient breastfeed?: 3 months with first child and 6 months with second child  Feeding Mother's Current Feeding Choice: Breast Milk and Formula Nipple Type: Slow - flow  Lactation Tools Discussed/Used Pump Education: Milk Storage  Interventions Interventions: Breast feeding basics reviewed;Education;LC Services brochure  Discharge Discharge Education: Engorgement and breast care;Warning signs for feeding baby Pump: DEBP;Hands Free;Manual;Personal  Consult Status Consult Status: Complete (mother declined follow up) Date: 12/01/23    Dema Severin BS, IBCLC 12/01/2023, 10:23 PM

## 2023-12-01 NOTE — MAU Provider Note (Signed)
S: Ms. Ashley Tate is a 33 y.o. G3P2002 at 102w5d  who presents to MAU today complaining of leaking of fluid since 1100. She  reports  vaginal bleeding, like "bloody show". She endorses contractions. She reports normal fetal movement.    O: BP 135/60   Pulse (!) 123   Temp 98.2 F (36.8 C)   Resp 17   SpO2 100%  GENERAL: Well-developed, well-nourished female in no acute distress.  HEAD: Normocephalic, atraumatic.  CHEST: Normal effort of breathing, regular heart rate ABDOMEN: Soft, nontender, gravid PELVIC: Normal external female genitalia. Vagina is pink and rugated. Cervix with normal contour, no lesions. Normal discharge.  Positive  pooling. Fern Collected.  Cervical exam:  Dilation: 5.5 Effacement (%): 90   Fetal Monitoring: FHT: 145, moderate variability  Toco:   Results for orders placed or performed during the hospital encounter of 12/01/23 (from the past 24 hours)  Fern Test     Status: Normal   Collection Time: 12/01/23  4:11 PM  Result Value Ref Range   POCT Fern Test Negative = intact amniotic membranes   CBC     Status: Abnormal   Collection Time: 12/01/23  4:58 PM  Result Value Ref Range   WBC 14.0 (H) 4.0 - 10.5 K/uL   RBC 4.26 3.87 - 5.11 MIL/uL   Hemoglobin 12.9 12.0 - 15.0 g/dL   HCT 59.5 63.8 - 75.6 %   MCV 89.4 80.0 - 100.0 fL   MCH 30.3 26.0 - 34.0 pg   MCHC 33.9 30.0 - 36.0 g/dL   RDW 43.3 29.5 - 18.8 %   Platelets 194 150 - 400 K/uL   nRBC 0.0 0.0 - 0.2 %  Type and screen [facility]     Status: None   Collection Time: 12/01/23  4:58 PM  Result Value Ref Range   ABO/RH(D) A POS    Antibody Screen NEG    Sample Expiration      12/04/2023,2359 Performed at [facility], [address] [place], [place] [address]      A: SIUP at [redacted]w[redacted]d  SROM  P: Report given to RN to contact MD on call for further instructions  Richardson Landry, CNM 12/01/2023 6:06 PM

## 2023-12-01 NOTE — Anesthesia Preprocedure Evaluation (Signed)
Anesthesia Evaluation  Patient identified by MRN, date of birth, ID band Patient awake    Reviewed: Allergy & Precautions, NPO status , Patient's Chart, lab work & pertinent test results  History of Anesthesia Complications Negative for: history of anesthetic complications  Airway Mallampati: II   Neck ROM: Full    Dental   Pulmonary asthma , Current Smoker and Patient abstained from smoking.   Pulmonary exam normal        Cardiovascular negative cardio ROS Normal cardiovascular exam     Neuro/Psych negative neurological ROS  negative psych ROS   GI/Hepatic negative GI ROS, Neg liver ROS,,,  Endo/Other  negative endocrine ROS    Renal/GU negative Renal ROS     Musculoskeletal negative musculoskeletal ROS (+)    Abdominal   Peds  Hematology negative hematology ROS (+)  Plt 194k    Anesthesia Other Findings   Reproductive/Obstetrics (+) Pregnancy                             Anesthesia Physical Anesthesia Plan  ASA: 2  Anesthesia Plan: Epidural   Post-op Pain Management:    Induction:   PONV Risk Score and Plan: 2 and Treatment may vary due to age or medical condition  Airway Management Planned: Natural Airway  Additional Equipment: None  Intra-op Plan:   Post-operative Plan:   Informed Consent: I have reviewed the patients History and Physical, chart, labs and discussed the procedure including the risks, benefits and alternatives for the proposed anesthesia with the patient or authorized representative who has indicated his/her understanding and acceptance.       Plan Discussed with: Anesthesiologist  Anesthesia Plan Comments: (Labs reviewed. Platelets acceptable, patient not taking any blood thinning medications. Per RN, FHR tracing reported to be stable enough for sitting procedure. Risks and benefits discussed with patient, including PDPH, backache, epidural  hematoma, failed epidural, blood pressure changes, allergic reaction, and nerve injury. Patient expressed understanding and wished to proceed.)       Anesthesia Quick Evaluation

## 2023-12-01 NOTE — MAU Note (Addendum)
Ashley Tate is a 33 y.o. at [redacted]w[redacted]d here in MAU reporting: had membranes sweep on Monday. "Could stretch to 4".  Woke up at 4, lost her plug, had some bloody show.  Started contracting. More consistent at noon. Are now every 5 min. Noted leaking 1100.  Onset of complaint: 0400 Pain score: 7 There were no vitals filed for this visit.   UEA:[id number] +FM Orders placed from triage:   Pt pacing in triage, would not sit down- will get vs in room

## 2023-12-02 ENCOUNTER — Other Ambulatory Visit: Payer: Self-pay

## 2023-12-02 ENCOUNTER — Inpatient Hospital Stay (HOSPITAL_COMMUNITY): Payer: Medicaid Other | Admitting: Anesthesiology

## 2023-12-02 ENCOUNTER — Encounter (HOSPITAL_COMMUNITY)
Admission: AD | Disposition: A | Discharge status: Home / Self Care | Payer: Self-pay | Source: Home / Self Care | Attending: Obstetrics and Gynecology

## 2023-12-02 DIAGNOSIS — Z302 Encounter for sterilization: Secondary | ICD-10-CM

## 2023-12-02 DIAGNOSIS — Z9851 Tubal ligation status: Principal | ICD-10-CM

## 2023-12-02 HISTORY — PX: TUBAL LIGATION: SHX77

## 2023-12-02 LAB — CBC
HCT: 35.4 % — ABNORMAL LOW (ref 36.0–46.0)
Hemoglobin: 12 g/dL (ref 12.0–15.0)
MCH: 30.4 pg (ref 26.0–34.0)
MCHC: 33.9 g/dL (ref 30.0–36.0)
MCV: 89.6 fL (ref 80.0–100.0)
Platelets: 167 10*3/uL (ref 150–400)
RBC: 3.95 MIL/uL (ref 3.87–5.11)
RDW: 13.6 % (ref 11.5–15.5)
WBC: 12.9 10*3/uL — ABNORMAL HIGH (ref 4.0–10.5)
nRBC: 0 % (ref 0.0–0.2)

## 2023-12-02 LAB — RPR: RPR Ser Ql: NONREACTIVE

## 2023-12-02 SURGERY — LIGATION, FALLOPIAN TUBE, POSTPARTUM
Anesthesia: Epidural | Laterality: Bilateral

## 2023-12-02 MED ORDER — LIDOCAINE-EPINEPHRINE (PF) 2 %-1:200000 IJ SOLN
INTRAMUSCULAR | Status: DC | PRN
  Administered 2023-12-02: 10 mL via EPIDURAL
  Administered 2023-12-02 (×2): 5 mL via EPIDURAL

## 2023-12-02 MED ORDER — FENTANYL CITRATE (PF) 100 MCG/2ML IJ SOLN
INTRAMUSCULAR | Status: AC
  Filled 2023-12-02: qty 2

## 2023-12-02 MED ORDER — CEFAZOLIN SODIUM-DEXTROSE 2-3 GM-%(50ML) IV SOLR
INTRAVENOUS | Status: DC | PRN
  Administered 2023-12-02: 2 g via INTRAVENOUS

## 2023-12-02 MED ORDER — FAMOTIDINE 20 MG PO TABS
40.0000 mg | ORAL_TABLET | Freq: Once | ORAL | Status: AC
  Administered 2023-12-02: 40 mg via ORAL
  Filled 2023-12-02: qty 2

## 2023-12-02 MED ORDER — ONDANSETRON HCL 4 MG/2ML IJ SOLN
INTRAMUSCULAR | Status: DC | PRN
  Administered 2023-12-02: 4 mg via INTRAVENOUS

## 2023-12-02 MED ORDER — FENTANYL CITRATE (PF) 100 MCG/2ML IJ SOLN
INTRAMUSCULAR | Status: DC | PRN
  Administered 2023-12-02: 100 ug via EPIDURAL

## 2023-12-02 MED ORDER — ACETAMINOPHEN 10 MG/ML IV SOLN: 1000.0000 mg | Freq: Once | INTRAVENOUS | Status: DC | PRN

## 2023-12-02 MED ORDER — OXYCODONE HCL 5 MG PO TABS
5.0000 mg | ORAL_TABLET | ORAL | 0 refills | Status: DC | PRN
Start: 2023-12-02 — End: 2023-12-02

## 2023-12-02 MED ORDER — METOCLOPRAMIDE HCL 10 MG PO TABS
10.0000 mg | ORAL_TABLET | Freq: Once | ORAL | Status: AC
  Administered 2023-12-02: 10 mg via ORAL
  Filled 2023-12-02: qty 1

## 2023-12-02 MED ORDER — LACTATED RINGERS IV SOLN: INTRAVENOUS | Status: DC

## 2023-12-02 MED ORDER — IBUPROFEN 600 MG PO TABS: 600.0000 mg | ORAL_TABLET | Freq: Four times a day (QID) | ORAL | 0 refills | Status: AC

## 2023-12-02 MED ORDER — OXYCODONE HCL 5 MG PO TABS: 5.0000 mg | ORAL_TABLET | Freq: Once | ORAL | Status: DC | PRN

## 2023-12-02 MED ORDER — LIDOCAINE-EPINEPHRINE (PF) 2 %-1:200000 IJ SOLN
INTRAMUSCULAR | Status: AC
  Filled 2023-12-02: qty 20

## 2023-12-02 MED ORDER — ACETAMINOPHEN 325 MG PO TABS: 650.0000 mg | ORAL_TABLET | ORAL | 0 refills | Status: AC | PRN

## 2023-12-02 MED ORDER — LACTATED RINGERS IV SOLN: INTRAVENOUS | Status: DC | PRN

## 2023-12-02 MED ORDER — ONDANSETRON HCL 4 MG/2ML IJ SOLN
INTRAMUSCULAR | Status: AC
  Filled 2023-12-02: qty 2

## 2023-12-02 MED ORDER — BENZOCAINE-MENTHOL 20-0.5 % EX AERO: 1.0000 | INHALATION_SPRAY | CUTANEOUS | 1 refills | Status: AC | PRN

## 2023-12-02 MED ORDER — FENTANYL CITRATE (PF) 100 MCG/2ML IJ SOLN: 25.0000 ug | INTRAMUSCULAR | Status: DC | PRN

## 2023-12-02 MED ORDER — CEFAZOLIN SODIUM-DEXTROSE 2-4 GM/100ML-% IV SOLN
INTRAVENOUS | Status: AC
  Filled 2023-12-02: qty 100

## 2023-12-02 MED ORDER — OXYCODONE HCL 5 MG/5ML PO SOLN: 5.0000 mg | Freq: Once | ORAL | Status: DC | PRN

## 2023-12-02 MED ORDER — OXYCODONE HCL 5 MG PO TABS: 5.0000 mg | ORAL_TABLET | ORAL | 0 refills | Status: AC | PRN

## 2023-12-02 MED ORDER — AMISULPRIDE (ANTIEMETIC) 5 MG/2ML IV SOLN: 10.0000 mg | Freq: Once | INTRAVENOUS | Status: DC | PRN

## 2023-12-02 SURGICAL SUPPLY — 21 items
CLOTH BEACON ORANGE TIMEOUT ST (SAFETY) ×1 IMPLANT
DRSG OPSITE POSTOP 3X4 (GAUZE/BANDAGES/DRESSINGS) ×1 IMPLANT
DURAPREP 26ML APPLICATOR (WOUND CARE) ×1 IMPLANT
ELECT REM PT RETURN 9FT ADLT (ELECTROSURGICAL) IMPLANT
ELECTRODE REM PT RTRN 9FT ADLT (ELECTROSURGICAL) IMPLANT
GLOVE BIO SURGEON STRL SZ 6.5 (GLOVE) ×1 IMPLANT
GLOVE BIOGEL PI IND STRL 7.0 (GLOVE) ×2 IMPLANT
GOWN STRL REUS W/TWL LRG LVL3 (GOWN DISPOSABLE) ×2 IMPLANT
NEEDLE HYPO 22GX1.5 SAFETY (NEEDLE) ×1 IMPLANT
NS IRRIG 1000ML POUR BTL (IV SOLUTION) ×1 IMPLANT
PACK ABDOMINAL MINOR (CUSTOM PROCEDURE TRAY) ×1 IMPLANT
PENCIL BUTTON HOLSTER BLD 10FT (ELECTRODE) IMPLANT
PROTECTOR NERVE ULNAR (MISCELLANEOUS) ×1 IMPLANT
SPONGE LAP 4X18 RFD (DISPOSABLE) IMPLANT
STRIP CLOSURE SKIN 1/2X4 (GAUZE/BANDAGES/DRESSINGS) IMPLANT
SUT MNCRL AB 3-0 PS2 27 (SUTURE) ×1 IMPLANT
SUT PLAIN ABS 2-0 CT1 27XMFL (SUTURE) ×2 IMPLANT
SUT VIC AB 0 CT1 27XBRD ANBCTR (SUTURE) ×1 IMPLANT
SYR CONTROL 10ML LL (SYRINGE) ×1 IMPLANT
TOWEL OR 17X24 6PK STRL BLUE (TOWEL DISPOSABLE) ×2 IMPLANT
TRAY FOLEY CATH SILVER 14FR (SET/KITS/TRAYS/PACK) ×1 IMPLANT

## 2023-12-02 NOTE — Progress Notes (Signed)
Subjective: Postpartum Day # 1 : S/P NSVD due to pt was admitted on 2/19 for SROM and latent labor at term, progressed to SVD over intact perineum, ebl , hgb drop of 12.9-12 stable, had viable baby female, desires PP tubal. Patient up ad lib, denies syncope or dizziness. Reports consuming regular diet without issues and denies N/V. Patient reports 0 bowel movement + passing flatus.  Denies issues with urination and reports bleeding is "lighter."  Patient is Breastfeeding and reports going well.  Desires PP tubal today for postpartum contraception.  Pain is being appropriately managed with use of po meds.   No laceration Feeding:  Breast Contraceptive plan:  PP Tubal Baby female.   Objective: Vital signs in last 24 hours: Patient Vitals for the past 24 hrs:  BP Temp Temp src Pulse Resp SpO2 Height Weight  12/02/23 1055 -- -- -- -- -- -- 5\' 3"  (1.6 m) --  12/02/23 1000 123/82 97.9 F (36.6 C) Oral 81 18 100 % -- --  12/02/23 0816 -- -- -- -- -- -- -- 98.9 kg  12/02/23 0513 124/78 (!) 97.5 F (36.4 C) Oral 77 17 99 % -- --  12/02/23 0139 111/68 97.9 F (36.6 C) Oral 91 16 98 % -- --  12/01/23 2300 136/83 -- -- -- -- -- -- --  12/01/23 2149 (!) 141/79 98.1 F (36.7 C) Oral (!) 106 15 99 % -- --  12/01/23 2055 122/79 97.8 F (36.6 C) Oral 82 16 100 % -- --  12/01/23 2018 (!) 121/95 -- -- 82 -- -- -- --  12/01/23 2000 -- -- -- 73 -- -- -- --  12/01/23 1945 (!) 142/71 -- -- 91 -- -- -- --  12/01/23 1930 129/79 -- -- 94 -- -- -- --  12/01/23 1915 131/72 -- -- 98 -- -- -- --  12/01/23 1901 132/72 -- -- 89 -- -- -- --  12/01/23 1831 (!) 126/92 -- -- 90 -- -- -- --  12/01/23 1801 128/75 -- -- (!) 102 -- -- -- --  12/01/23 1756 (!) 146/83 -- -- (!) 112 -- -- -- --  12/01/23 1751 (!) 145/83 -- -- (!) 106 -- -- -- --  12/01/23 1747 135/60 -- -- (!) 123 -- -- -- --  12/01/23 1741 138/74 -- -- (!) 106 -- -- -- --  12/01/23 1738 (!) 132/48 -- -- (!) 105 -- -- -- --  12/01/23 1549 --  98.2 F (36.8 C) -- (!) 101 17 100 % -- --     Physical Exam:  General: alert, cooperative, and appears stated age Mood/Affect: happy Lungs: clear to auscultation, no wheezes, rales or rhonchi, symmetric air entry.  Heart: normal rate, regular rhythm, normal S1, S2, no murmurs, rubs, clicks or gallops. Breast: breasts appear normal, no suspicious masses, no skin or nipple changes or axillary nodes. Abdomen:  + bowel sounds, soft, non-tender GU: perineum intact, healing well. No signs of external hematomas.  Uterine Fundus: firm Lochia: appropriate Skin: Warm, Dry. DVT Evaluation: No evidence of DVT seen on physical exam. Negative Homan's sign. No cords or calf tenderness. No significant calf/ankle edema.     Latest Ref Rng & Units 12/02/2023    5:08 AM 12/01/2023    4:58 PM 04/20/2023    4:08 PM  CBC  WBC 4.0 - 10.5 K/uL 12.9  14.0  12.2   Hemoglobin 12.0 - 15.0 g/dL 16.1  09.6  04.5   Hematocrit 36.0 - 46.0 % 35.4  38.1  41.1   Platelets 150 - 400 K/uL 167  194  187     Results for orders placed or performed during the hospital encounter of 12/01/23 (from the past 24 hours)  Fern Test     Status: Normal   Collection Time: 12/01/23  4:11 PM  Result Value Ref Range   POCT Fern Test Negative = intact amniotic membranes   CBC     Status: Abnormal   Collection Time: 12/01/23  4:58 PM  Result Value Ref Range   WBC 14.0 (H) 4.0 - 10.5 K/uL   RBC 4.26 3.87 - 5.11 MIL/uL   Hemoglobin 12.9 12.0 - 15.0 g/dL   HCT 16.1 09.6 - 04.5 %   MCV 89.4 80.0 - 100.0 fL   MCH 30.3 26.0 - 34.0 pg   MCHC 33.9 30.0 - 36.0 g/dL   RDW 40.9 81.1 - 91.4 %   Platelets 194 150 - 400 K/uL   nRBC 0.0 0.0 - 0.2 %  Type and screen [facility]     Status: None   Collection Time: 12/01/23  4:58 PM  Result Value Ref Range   ABO/RH(D) A POS    Antibody Screen NEG    Sample Expiration      12/04/2023,2359 Performed at [facility], [address] [place], [place] [address]    RPR     Status: None   Collection Time: 12/01/23  4:58 PM  Result Value Ref Range   RPR Ser Ql NON REACTIVE NON REACTIVE  CBC     Status: Abnormal   Collection Time: 12/02/23  5:08 AM  Result Value Ref Range   WBC 12.9 (H) 4.0 - 10.5 K/uL   RBC 3.95 3.87 - 5.11 MIL/uL   Hemoglobin 12.0 12.0 - 15.0 g/dL   HCT 62.1 (L) 30.8 - 65.7 %   MCV 89.6 80.0 - 100.0 fL   MCH 30.4 26.0 - 34.0 pg   MCHC 33.9 30.0 - 36.0 g/dL   RDW 84.6 96.2 - 95.2 %   Platelets 167 150 - 400 K/uL   nRBC 0.0 0.0 - 0.2 %     CBG (last 3)  No results for input(s): "GLUCAP" in the last 72 hours.   I/O last 3 completed shifts: In: 0  Out: 112 [Blood:112]   Assessment Postpartum Day # 1 : S/P NSVD due to pt was admitted on 2/19 for SROM and latent labor at term, progressed to SVD over intact perineum, ebl , hgb drop of 12.9-12 stable, had viable baby female, desires PP tubal. Pt stable. -1 involution. Breastfeeding. Hemodynamically stable.   Plan: Continue other mgmt as ordered PP Tubal scheduled for today at 2pm, NPO since 0600. VTE prophylactics: Early ambulated as tolerates.  Pain control: Motrin/Tylenol PRN Education given regarding options for contraception, including tubal.  Plan for discharge tomorrow, Breastfeeding, and Lactation consult  Dr. Normand Sloop to be updated on patient status  [facility] CNM, FNP-C, PMHNP-BC  [id number]  [place], [place] [address]  Cell: [phone]  Office Phone: [phone] Fax: [phone] 12/02/2023  11:20 AM

## 2023-12-02 NOTE — Plan of Care (Signed)
  Problem: Education: Goal: Knowledge of Childbirth will improve Outcome: Progressing Goal: Ability to make informed decisions regarding treatment and plan of care will improve Outcome: Progressing Goal: Ability to state and carry out methods to decrease the pain will improve Outcome: Progressing Goal: Individualized Educational Video(s) Outcome: Progressing   Problem: Coping: Goal: Ability to verbalize concerns and feelings about labor and delivery will improve Outcome: Progressing   Problem: Life Cycle: Goal: Ability to make normal progression through stages of labor will improve Outcome: Progressing Goal: Ability to effectively push during vaginal delivery will improve Outcome: Progressing   Problem: Role Relationship: Goal: Will demonstrate positive interactions with the child Outcome: Progressing   Problem: Safety: Goal: Risk of complications during labor and delivery will decrease Outcome: Progressing   Problem: Pain Management: Goal: Relief or control of pain from uterine contractions will improve Outcome: Progressing   Problem: Education: Goal: Knowledge of General Education information will improve Description: Including pain rating scale, medication(s)/side effects and non-pharmacologic comfort measures Outcome: Progressing   Problem: Health Behavior/Discharge Planning: Goal: Ability to manage health-related needs will improve Outcome: Progressing   Problem: Clinical Measurements: Goal: Ability to maintain clinical measurements within normal limits will improve Outcome: Progressing Goal: Will remain free from infection Outcome: Progressing Goal: Diagnostic test results will improve Outcome: Progressing Goal: Respiratory complications will improve Outcome: Progressing Goal: Cardiovascular complication will be avoided Outcome: Progressing   Problem: Activity: Goal: Risk for activity intolerance will decrease Outcome: Progressing   Problem:  Nutrition: Goal: Adequate nutrition will be maintained Outcome: Progressing   Problem: Coping: Goal: Level of anxiety will decrease Outcome: Progressing   Problem: Elimination: Goal: Will not experience complications related to bowel motility Outcome: Progressing Goal: Will not experience complications related to urinary retention Outcome: Progressing   Problem: Pain Managment: Goal: General experience of comfort will improve and/or be controlled Outcome: Progressing   Problem: Safety: Goal: Ability to remain free from injury will improve Outcome: Progressing   Problem: Skin Integrity: Goal: Risk for impaired skin integrity will decrease Outcome: Progressing   Problem: Education: Goal: Knowledge of condition will improve Outcome: Progressing Goal: Individualized Educational Video(s) Outcome: Progressing Goal: Individualized Newborn Educational Video(s) Outcome: Progressing   Problem: Activity: Goal: Will verbalize the importance of balancing activity with adequate rest periods Outcome: Progressing Goal: Ability to tolerate increased activity will improve Outcome: Progressing   Problem: Coping: Goal: Ability to identify and utilize available resources and services will improve Outcome: Progressing   Problem: Life Cycle: Goal: Chance of risk for complications during the postpartum period will decrease Outcome: Progressing   Problem: Role Relationship: Goal: Ability to demonstrate positive interaction with newborn will improve Outcome: Progressing   Problem: Skin Integrity: Goal: Demonstration of wound healing without infection will improve Outcome: Progressing

## 2023-12-02 NOTE — Discharge Summary (Signed)
SVD OB Discharge Summary       Patient Name: Ashley Tate DOB: [birth year] MRN: [id number]  Date of admission: 12/01/2023 Delivering MD: Dale Valmont Date of delivery: 2/19 Type of delivery: svd  Newborn Data: Sex: Baby female AVA Live born female  Birth Weight: 6 lb 14.8 oz (3140 g) APGAR: 9, 9  Newborn Delivery   Birth date/time: 12/01/2023 19:10:00 Delivery type: Vaginal, Spontaneous     Feeding: breast Infant being discharge to home with mother in stable condition.   Admitting diagnosis: Normal labor [O80, Z37.9] Intrauterine pregnancy: [redacted]w[redacted]d     Secondary diagnosis:  Active Problems:   Normal labor   SVD (spontaneous vaginal delivery)   Normal postpartum course   S/P tubal ligation                                Complications: None                                                              Intrapartum Procedures: spontaneous vaginal delivery and tubal ligation Postpartum Procedures: none Complications-Operative and Postpartum: none Augmentation: Pitocin   History of Present Illness: Ms. Ashley Tate is a 33 y.o. female, G3P2002, who presents at [redacted]w[redacted]d weeks gestation. The patient has been followed at  [facility]  Her pregnancy has been complicated by:  Patient Active Problem List   Diagnosis Date Noted   S/P tubal ligation 12/02/2023   Normal labor 12/01/2023   SVD (spontaneous vaginal delivery) 12/01/2023   Normal postpartum course 12/01/2023   Obesity affecting pregnancy, antepartum 07/21/2023     Active Ambulatory Problems    Diagnosis Date Noted   Obesity affecting pregnancy, antepartum 07/21/2023   Resolved Ambulatory Problems    Diagnosis Date Noted   Breech presentation 11/03/2018   Normal labor 11/03/2018   Breech presentation delivered 11/03/2018   Normal postpartum course 11/04/2018   Past Medical History:  Diagnosis Date   Asthma    Pap smear abnormality of vagina with ASC-US      Hospital  course:  Onset of Labor With Vaginal Delivery      33 y.o. yo [id number] at [redacted]w[redacted]d was admitted in Latent Labor on 12/01/2023. Labor course was complicated by see below  Membrane Rupture Time/Date: 11:00 AM,12/01/2023  Delivery Method:Vaginal, Spontaneous Operative Delivery:N/A Episiotomy: None Lacerations:  None Patient had a postpartum course complicated by none.  She is ambulating, tolerating a regular diet, passing flatus, and urinating well. Patient is discharged home in stable condition on 12/02/23.  Newborn Data: Birth date:12/01/2023 Birth time:7:10 PM Gender:Female Living status:Living Apgars:9 ,9  Weight:3140 g Postpartum Day # 1 : S/P NSVD due to pt was admitted on 2/19 for SROM and latent labor at term, progressed to SVD over intact perineum, ebl , hgb drop of 12.9-12 stable, had viable baby female, desires PP tubal. Patient up ad lib, denies syncope or dizziness. Reports consuming regular diet without issues and denies N/V. Patient reports 0 bowel movement + passing flatus.  Denies issues with urination and reports bleeding is "lighter."  Patient is Breastfeeding and reports going well.  Desires PP tubal today for postpartum contraception.  Pain is being appropriately managed with use of po meds.  Went for PP tubal on     Physical exam  Vitals:   12/02/23 1055 12/02/23 1529 12/02/23 1530 12/02/23 1545  BP:  111/84 111/84 (!) 100/59  Pulse:  76 70 72  Resp:  18 (!) 9 18  Temp:  (!) 97.5 F (36.4 C)    TempSrc:  Oral    SpO2:  98% 98% 95%  Weight:      Height: 5\' 3"  (1.6 m)      General: alert, cooperative, and no distress Lochia: appropriate Uterine Fundus: firm Incision: Healing well with no significant drainage, No significant erythema, Dressing is clean, dry, and intact, honeycomb dressing CDI Perineum: intact DVT Evaluation: No evidence of DVT seen on physical exam. Negative Homan's sign. No cords or calf tenderness.  Labs: Lab Results  Component Value Date    WBC 12.9 (H) 12/02/2023   HGB 12.0 12/02/2023   HCT 35.4 (L) 12/02/2023   MCV 89.6 12/02/2023   PLT 167 12/02/2023      Latest Ref Rng & Units 04/20/2023    4:08 PM  CMP  Glucose 70 - 99 mg/dL 86   BUN 6 - 20 mg/dL <5   Creatinine 4.09 - 1.00 mg/dL 8.11   Sodium 914 - 782 mmol/L 136   Potassium 3.5 - 5.1 mmol/L 3.8   Chloride 98 - 111 mmol/L 107   CO2 22 - 32 mmol/L 21   Calcium 8.9 - 10.3 mg/dL 8.5   Total Protein 6.5 - 8.1 g/dL 7.4   Total Bilirubin 0.3 - 1.2 mg/dL 0.4   Alkaline Phos 38 - 126 U/L 61   AST 15 - 41 U/L 21   ALT 0 - 44 U/L 18     Date of discharge: 12/02/2023 Discharge Diagnoses: Term Pregnancy-delivered Discharge instruction: per After Visit Summary and "Baby and Me Booklet".  After visit meds:   Activity:           unrestricted and pelvic rest Advance as tolerated. Pelvic rest for 6 weeks.  Diet:                routine Medications: PNV, Ibuprofen, Colace, and oxy ir  Postpartum contraception: Tubal Ligation Condition:  Pt discharge to home with baby in stable  Meds: Allergies as of 12/02/2023       Reactions   Aspirin Nausea Only        Medication List     STOP taking these medications    metroNIDAZOLE 500 MG tablet Commonly known as: FLAGYL       TAKE these medications    acetaminophen 325 MG tablet Commonly known as: Tylenol Take 2 tablets (650 mg total) by mouth every 4 (four) hours as needed (for pain scale < 4).   albuterol 108 (90 Base) MCG/ACT inhaler Commonly known as: VENTOLIN HFA Inhale 2 puffs into the lungs every 6 (six) hours as needed for wheezing or shortness of breath.   benzocaine-Menthol 20-0.5 % Aero Commonly known as: DERMOPLAST Apply 1 Application topically as needed for irritation (perineal discomfort).   cholecalciferol 25 MCG (1000 UNIT) tablet Commonly known as: VITAMIN D3 Take 2,000 Units by mouth daily.   ibuprofen 600 MG tablet Commonly known as: ADVIL Take 1 tablet (600 mg total) by mouth every  6 (six) hours.   multivitamin with minerals Tabs tablet Take 1 tablet by mouth daily.   oxyCODONE 5 MG immediate release tablet Commonly known as: Oxy IR/ROXICODONE Take 1 tablet (5 mg total) by mouth every 4 (four) hours  as needed for severe pain (pain score 7-10).        Discharge Follow Up:   Follow-up Information     [facility]. Schedule an appointment as soon as possible for a visit in 6 week(s).   Specialty: Obstetrics and Gynecology Contact information: [address] [place] [address] [phone]                 [facility] CNM, FNP-C, PMHNP-BC  [address]  [place], [place] [address]  Cell: [phone]  Office Phone: [phone] Fax: [phone] 12/02/2023  3:59 PM

## 2023-12-02 NOTE — Anesthesia Postprocedure Evaluation (Signed)
Anesthesia Post Note  Patient: Ashley Tate  Procedure(s) Performed: POST PARTUM TUBAL LIGATION (Bilateral)     Patient location during evaluation: Mother Baby Anesthesia Type: Epidural Level of consciousness: oriented and awake and alert Pain management: pain level controlled Vital Signs Assessment: post-procedure vital signs reviewed and stable Respiratory status: spontaneous breathing and respiratory function stable Cardiovascular status: blood pressure returned to baseline and stable Postop Assessment: no headache, no backache, no apparent nausea or vomiting and able to ambulate Anesthetic complications: no  No notable events documented.  Last Vitals:  Vitals:   12/02/23 1530 12/02/23 1545  BP: 111/84 (!) 100/59  Pulse: 70 72  Resp: (!) 9 18  Temp:    SpO2: 98% 95%    Last Pain:  Vitals:   12/02/23 1529  TempSrc: Oral  PainSc: 0-No pain   Pain Goal:    LLE Motor Response: Purposeful movement (12/02/23 1529) LLE Sensation: Tingling (12/02/23 1529) RLE Motor Response: Purposeful movement (12/02/23 1529) RLE Sensation: Tingling (12/02/23 1529)     Epidural/Spinal Function Cutaneous sensation: Pins and Needles (12/02/23 1529), Patient able to flex knees: Yes (12/02/23 1529), Patient able to lift hips off bed: No (12/02/23 1529), Back pain beyond tenderness at insertion site: No (12/02/23 1529), Progressively worsening motor and/or sensory loss: No (12/02/23 1529), Bowel and/or bladder incontinence post epidural: No (12/02/23 1529)  Trevor Iha

## 2023-12-02 NOTE — Progress Notes (Signed)
Pt had 2 consecutive elevated BPs, midwife [facility] made aware. No new orders at this time due to pt being asymptomatic and bleeding is controlled. Parameters to notify MD are a Systolic BP of 160> and Diastolic of 110>.   [place] 12/02/23 0007

## 2023-12-02 NOTE — Op Note (Signed)
reop diagnosis: Multiparity desires sterilization  Postop diagnosis: Same  Procedure: Postpartum tubal ligation Surgeon: Lorenza Shakir Anesthesia:Epidural Estimated blood loss: Minimal Complications: None  Pathology: Bilateral portion of tubes Condition and transferred to PACU: stable   Before the tubal ligation was done, the patient was told that the risks are but not limited to bleeding infection damage to internal organs such as bowel bladder major blood vessels. The patient understands that the failure rate is 1 in 200. She understands a half of those failures can result in ectopic or tubal pregnancy. The patient was also well aware of all birth control that was available to her. The patient was taken to the operating room. Once her epidural was found to be adequate, a timeout was done. The patient's consent was signed.  20 cc of local was she used to infiltrate around the umbilicus to obtain adequate anesthesia. This amounts was used because the patient felt prickly touch with the Allis clamp. Once all the anesthesia was found to be adequate, I began the procedure. A 10 mm infraumbilical incision was made along her previous incision. The fascia was then grasped with 2 cokers after the subcutaneous tissue had been bluntly and sharply dissected. The fascia was then incised and extended bilaterally. The peritoneum was entered bluntly. The patient's right fallopian tube was grasped with Babcock clamp. Follow out to the fimbriated end. And a half a centimeter of the mid isthmic portion of the tube was ligated with 2-0 plain and excised. The patient's left fallopian tube was grasped with Babcock clamp followed out to the fimbriated end. About a centimeter of the mid isthmic portion of the tube was ligated with 2-0 plain and excised. Both tubes were returned to the abdomen. Hemostasis was noted. The peritoneum was closed with 0 Vicryl. The fascia was also closed using 0 Vicryl. The skin was closed with 3-0  Monocryl in subcuticular fashion. Sponge lap and needle counts were correct. Patient went to recovery in stable condition.  

## 2023-12-02 NOTE — Anesthesia Postprocedure Evaluation (Signed)
Anesthesia Post Note  Patient: Ashley Tate  Procedure(s) Performed: AN AD HOC EPIDURAL     Patient location during evaluation: Mother Baby Anesthesia Type: Epidural Level of consciousness: awake and alert and oriented Pain management: satisfactory to patient Vital Signs Assessment: post-procedure vital signs reviewed and stable Respiratory status: respiratory function stable Cardiovascular status: stable Postop Assessment: no headache, no backache, epidural receding, patient able to bend at knees, no signs of nausea or vomiting, adequate PO intake and able to ambulate Anesthetic complications: no   No notable events documented.  Last Vitals:  Vitals:   12/02/23 0139 12/02/23 0513  BP: 111/68 124/78  Pulse: 91 77  Resp: 16 17  Temp: 36.6 C (!) 36.4 C  SpO2: 98% 99%    Last Pain:  Vitals:   12/02/23 0527  TempSrc:   PainSc: Asleep   Pain Goal:                   Karleen Dolphin

## 2023-12-02 NOTE — Anesthesia Preprocedure Evaluation (Addendum)
Anesthesia Evaluation  Patient identified by MRN, date of birth, ID band Patient awake    Reviewed: Allergy & Precautions, NPO status , Patient's Chart, lab work & pertinent test results  Airway Mallampati: II       Dental no notable dental hx.    Pulmonary asthma , Current Smoker and Patient abstained from smoking.   Pulmonary exam normal        Cardiovascular negative cardio ROS Normal cardiovascular exam     Neuro/Psych negative neurological ROS     GI/Hepatic negative GI ROS, Neg liver ROS,,,  Endo/Other  negative endocrine ROS    Renal/GU negative Renal ROS     Musculoskeletal negative musculoskeletal ROS (+)    Abdominal  (+) + obese  Peds  Hematology negative hematology ROS (+)   Anesthesia Other Findings Desires Sterility   Reproductive/Obstetrics                             Anesthesia Physical Anesthesia Plan  ASA: 2  Anesthesia Plan: Epidural   Post-op Pain Management:    Induction:   PONV Risk Score and Plan: Ondansetron, Dexamethasone and Treatment may vary due to age or medical condition  Airway Management Planned: Simple Face Mask  Additional Equipment:   Intra-op Plan:   Post-operative Plan:   Informed Consent: I have reviewed the patients History and Physical, chart, labs and discussed the procedure including the risks, benefits and alternatives for the proposed anesthesia with the patient or authorized representative who has indicated his/her understanding and acceptance.     Dental advisory given  Plan Discussed with: CRNA  Anesthesia Plan Comments:        Anesthesia Quick Evaluation

## 2023-12-02 NOTE — Transfer of Care (Signed)
Immediate Anesthesia Transfer of Care Note  Patient: Ashley Tate  Procedure(s) Performed: POST PARTUM TUBAL LIGATION (Bilateral)  Patient Location: PACU  Anesthesia Type:Epidural  Level of Consciousness: awake  Airway & Oxygen Therapy: Patient Spontanous Breathing  Post-op Assessment: Report given to RN and Post -op Vital signs reviewed and stable  Post vital signs: Reviewed and stable  Last Vitals:  Vitals Value Taken Time  BP 111/84 12/02/23 1530  Temp 36.4 C 12/02/23 1529  Pulse 72 12/02/23 1535  Resp 20 12/02/23 1535  SpO2 95 % 12/02/23 1535  Vitals shown include unfiled device data.  Last Pain:  Vitals:   12/02/23 1529  TempSrc: Oral  PainSc:          Complications: No notable events documented.

## 2023-12-03 ENCOUNTER — Encounter (HOSPITAL_COMMUNITY): Payer: Self-pay | Admitting: Obstetrics and Gynecology

## 2023-12-03 NOTE — Anesthesia Postprocedure Evaluation (Signed)
Anesthesia Post Note  Patient: Ashley Tate  Procedure(s) Performed: AN AD HOC LABOR EPIDURAL     Patient location during evaluation: Mother Baby Anesthesia Type: Epidural Level of consciousness: awake and alert Pain management: pain level controlled Vital Signs Assessment: post-procedure vital signs reviewed and stable Respiratory status: spontaneous breathing, nonlabored ventilation and respiratory function stable Cardiovascular status: stable Postop Assessment: no headache, no backache and epidural receding Anesthetic complications: no   No notable events documented.  Last Vitals: There were no vitals filed for this visit.  Last Pain: There were no vitals filed for this visit.               Shelton Silvas

## 2023-12-06 LAB — SURGICAL PATHOLOGY

## 2023-12-10 ENCOUNTER — Telehealth (HOSPITAL_COMMUNITY): Payer: Self-pay

## 2023-12-10 NOTE — Telephone Encounter (Signed)
 12/10/2023 1954  Name: Dulce Martian MRN: [id number] DOB: [birth year]  Reason for Call:  Transition of Care Hospital Discharge Call  Contact Status: Patient Contact Status: Message  Language assistant needed:          Follow-Up Questions:    Inocente Salles Postnatal Depression Scale:  In the Past 7 Days:    PHQ2-9 Depression Scale:     Discharge Follow-up:    Post-discharge interventions: NA  Signature  Signe Colt

## 2024-01-12 DIAGNOSIS — Z1331 Encounter for screening for depression: Secondary | ICD-10-CM | POA: Diagnosis not present
